# Patient Record
Sex: Female | Born: 1980
Health system: Southern US, Community
[De-identification: ages and names within clinical notes are randomized; demographics above are authoritative.]

## PROBLEM LIST (undated history)

## (undated) DIAGNOSIS — F419 Anxiety disorder, unspecified: Secondary | ICD-10-CM

## (undated) DIAGNOSIS — I10 Essential (primary) hypertension: Secondary | ICD-10-CM

## (undated) HISTORY — DX: Essential (primary) hypertension: I10

## (undated) HISTORY — DX: Anxiety disorder, unspecified: F41.9

## (undated) HISTORY — PX: TUBAL LIGATION: SHX77

---

## 1998-03-30 ENCOUNTER — Other Ambulatory Visit: Admission: RE | Admit: 1998-03-30 | Discharge: 1998-03-30 | Payer: Self-pay | Admitting: Family Medicine

## 1998-05-17 ENCOUNTER — Encounter: Payer: Self-pay | Admitting: Family Medicine

## 1998-05-17 ENCOUNTER — Ambulatory Visit (HOSPITAL_COMMUNITY): Admission: RE | Admit: 1998-05-17 | Discharge: 1998-05-17 | Payer: Self-pay | Admitting: Family Medicine

## 1998-10-25 ENCOUNTER — Inpatient Hospital Stay (HOSPITAL_COMMUNITY): Admission: AD | Admit: 1998-10-25 | Discharge: 1998-10-28 | Payer: Self-pay | Admitting: Family Medicine

## 1998-10-26 ENCOUNTER — Encounter: Payer: Self-pay | Admitting: Family Medicine

## 1999-08-02 ENCOUNTER — Other Ambulatory Visit: Admission: RE | Admit: 1999-08-02 | Discharge: 1999-08-02 | Payer: Self-pay | Admitting: Family Medicine

## 2004-08-04 ENCOUNTER — Other Ambulatory Visit: Admission: RE | Admit: 2004-08-04 | Discharge: 2004-08-04 | Payer: Self-pay | Admitting: Family Medicine

## 2006-04-07 ENCOUNTER — Emergency Department (HOSPITAL_COMMUNITY): Admission: EM | Admit: 2006-04-07 | Discharge: 2006-04-07 | Payer: Self-pay | Admitting: Emergency Medicine

## 2006-06-25 ENCOUNTER — Inpatient Hospital Stay (HOSPITAL_COMMUNITY): Admission: AD | Admit: 2006-06-25 | Discharge: 2006-07-01 | Payer: Self-pay | Admitting: Obstetrics and Gynecology

## 2006-06-25 ENCOUNTER — Ambulatory Visit: Payer: Self-pay | Admitting: Cardiology

## 2006-06-28 ENCOUNTER — Encounter (INDEPENDENT_AMBULATORY_CARE_PROVIDER_SITE_OTHER): Payer: Self-pay | Admitting: *Deleted

## 2006-06-28 ENCOUNTER — Ambulatory Visit: Payer: Self-pay | Admitting: Family Medicine

## 2006-06-28 ENCOUNTER — Encounter: Payer: Self-pay | Admitting: Obstetrics & Gynecology

## 2009-08-01 ENCOUNTER — Inpatient Hospital Stay (HOSPITAL_COMMUNITY): Admission: AD | Admit: 2009-08-01 | Discharge: 2009-08-01 | Payer: Self-pay | Admitting: Obstetrics and Gynecology

## 2009-09-07 ENCOUNTER — Ambulatory Visit: Payer: Self-pay | Admitting: Physician Assistant

## 2009-09-07 ENCOUNTER — Inpatient Hospital Stay (HOSPITAL_COMMUNITY): Admission: AD | Admit: 2009-09-07 | Discharge: 2009-09-07 | Payer: Self-pay | Admitting: Obstetrics and Gynecology

## 2009-09-16 ENCOUNTER — Inpatient Hospital Stay (HOSPITAL_COMMUNITY): Admission: AD | Admit: 2009-09-16 | Discharge: 2009-09-18 | Payer: Self-pay | Admitting: Obstetrics and Gynecology

## 2010-10-08 LAB — CBC
HCT: 29.3 % — ABNORMAL LOW (ref 36.0–46.0)
Hemoglobin: 9.8 g/dL — ABNORMAL LOW (ref 12.0–15.0)
MCHC: 33.4 g/dL (ref 30.0–36.0)
MCV: 91 fL (ref 78.0–100.0)
Platelets: 235 10*3/uL (ref 150–400)
RBC: 3.22 MIL/uL — ABNORMAL LOW (ref 3.87–5.11)
RDW: 13.9 % (ref 11.5–15.5)
WBC: 8.3 10*3/uL (ref 4.0–10.5)

## 2010-10-08 LAB — URINALYSIS, ROUTINE W REFLEX MICROSCOPIC
Bilirubin Urine: NEGATIVE
Glucose, UA: NEGATIVE mg/dL
Hgb urine dipstick: NEGATIVE
Ketones, ur: 15 mg/dL — AB
Nitrite: NEGATIVE
Protein, ur: NEGATIVE mg/dL
Specific Gravity, Urine: 1.025 (ref 1.005–1.030)
Urobilinogen, UA: 2 mg/dL — ABNORMAL HIGH (ref 0.0–1.0)
pH: 6.5 (ref 5.0–8.0)

## 2010-10-08 LAB — COMPREHENSIVE METABOLIC PANEL
ALT: 12 U/L (ref 0–35)
Alkaline Phosphatase: 76 U/L (ref 39–117)
BUN: 5 mg/dL — ABNORMAL LOW (ref 6–23)
CO2: 23 mEq/L (ref 19–32)
Chloride: 106 mEq/L (ref 96–112)
GFR calc non Af Amer: >60 mL/min (ref >60)
Glucose, Bld: 77 mg/dL (ref 70–99)
Potassium: 3.2 mEq/L — ABNORMAL LOW (ref 3.5–5.1)
Sodium: 137 mEq/L (ref 135–145)
Total Bilirubin: 0.5 mg/dL (ref 0.3–1.2)
Total Protein: 5.8 g/dL — ABNORMAL LOW (ref 6.0–8.3)

## 2010-10-08 LAB — DIFFERENTIAL
Basophils Absolute: 0 10*3/uL (ref 0.0–0.1)
Basophils Relative: 0 % (ref 0–1)
Eosinophils Absolute: 0.1 10*3/uL (ref 0.0–0.7)
Monocytes Relative: 8 % (ref 3–12)
Neutro Abs: 5.8 10*3/uL (ref 1.7–7.7)
Neutrophils Relative %: 70 % (ref 43–77)

## 2010-10-11 LAB — COMPREHENSIVE METABOLIC PANEL
ALT: 13 U/L (ref 0–35)
AST: 19 U/L (ref 0–37)
Albumin: 3 g/dL — ABNORMAL LOW (ref 3.5–5.2)
Calcium: 9.1 mg/dL (ref 8.4–10.5)
Chloride: 106 mEq/L (ref 96–112)
Creatinine, Ser: 0.53 mg/dL (ref 0.4–1.2)
GFR calc Af Amer: >60 mL/min (ref >60)
Sodium: 138 mEq/L (ref 135–145)
Total Bilirubin: 0.4 mg/dL (ref 0.3–1.2)

## 2010-10-11 LAB — URINALYSIS, DIPSTICK ONLY
Bilirubin Urine: NEGATIVE
Ketones, ur: 15 mg/dL — AB
Specific Gravity, Urine: <1.005 — ABNORMAL LOW (ref 1.005–1.030)
pH: 6.5 (ref 5.0–8.0)

## 2010-10-11 LAB — CBC
HCT: 24.3 % — ABNORMAL LOW (ref 36.0–46.0)
Hemoglobin: 11.1 g/dL — ABNORMAL LOW (ref 12.0–15.0)
Hemoglobin: 8.2 g/dL — ABNORMAL LOW (ref 12.0–15.0)
MCHC: 33.5 g/dL (ref 30.0–36.0)
RBC: 3.71 MIL/uL — ABNORMAL LOW (ref 3.87–5.11)
WBC: 21.3 10*3/uL — ABNORMAL HIGH (ref 4.0–10.5)

## 2010-10-11 LAB — RPR: RPR Ser Ql: NONREACTIVE

## 2010-10-11 LAB — HEMOGLOBIN AND HEMATOCRIT, BLOOD: HCT: 27.6 % — ABNORMAL LOW (ref 36.0–46.0)

## 2010-12-08 NOTE — Discharge Summary (Signed)
NAMEJAYLEA, Wendy Thompson                 ACCOUNT NO.:  [id number]   MEDICAL RECORD NO.:  [id number]          PATIENT TYPE:  INP   LOCATION:  9305                          FACILITY:  [facility]   PHYSICIAN:  Lesly Dukes, M.D. DATE OF BIRTH:  1981-05-07   DATE OF ADMISSION:  06/28/2006  DATE OF DISCHARGE:  07/01/2006                               DISCHARGE SUMMARY   REASON FOR ADMISSION:  Preeclampsia.   HOSPITAL COURSE:  This is a 30 year old, G2, P1-0-0-1 who presented at  28 weeks 4 days at [facility]. There it was noted that she had  severe TIAs and preeclampsia. Her creatinine went from 0.4 to 0.9. Her  urine output was around 20 mL an hour and decreasing. She had received a  course of betamethasone x2 at [facility]. She was transferred from [facility] over to [facility]. Upon arrival, we started her on magnesium  sulfate. It was noted that she had elevated blood pressure, systolic  130s to 168, diastolic 80-95. It was also noted that her urine output  was decreasing with 50, 50 and 75 over the first 3 hours here. Her  physical examination was within normal limits. Due to her abnormal labs  and presentation, we initially decided we would try to induce the  patient; however, on ultrasound it was noted that the baby was breech.  Therefore we took the patient to C section on the afternoon of December  7. See operative note for details. The patient went to C section on  June 28, 2006, had a low cervical transverse C section which produced  a  viable female infant with Apgar's of 7 at 1 minute and 8 at 5 minutes  and cord pH 7.32, no complications at time of delivery. Please see  operative note for full details. After delivery, the patient was  transferred to the [facility] where she continued to do well. She was  continued on magnesium sulfate for over 24 hours. During this time, she  continued to have very adequate diuresis but no greater than 150 mL of  urine an hour. Although  her pressures remained elevated, they began to  trend down. We did start her on HCTZ 25 mg p.o. daily which also helped  her blood pressures. The patient continued to have some edema and  abdominal distention; however, this is gradually improving each day. On  day of discharge, the patient continue to have very adequate urine  output and was negative over the past 16-hours with more out than in.  She continue to have some edema though this appeared to be mildly  improved from the previous evening. The patient's blood pressure on day  of discharge, two readings were 139/81 and 157/93.   PERTINENT LABS:  On the day of admission at [facility] labs  were drawn which showed abnormalities including an increase in  creatinine from 0.4 to 0.8, urine volume remained low at 525. Initial  labs on the 7th showed an elevated uric acid of 9.0 which was increased  on the 5th which was 7.8. Liver  functions on initial exam were within  normal limits. CBC, platelets were normal at 239, LDH on initial labs  are elevated at 278. Once admitted to [facility], repeat labs  showed elevated uric acid as well 9.3, BMP showed increased liver  enzymes and AST of 38, ALT of 34. Platelets were stable. Once she had  the C section, the labs continued to improve and magnesium levels  remained in normal range at 5. Followup CBCs showed normal platelets at  186, LDH stable at 289, Uric acid relatively at 9.8. On December 8,  liver enzymes increased mildly to 50 AST, 43 ALT. On December 9, repeat  labs, platelets stable at 165. Liver enzymes trended down, AST 20, ALT  31, LDH trending down within normal limits 203. On December 9, uric acid  still elevated at 10. The most recent hemoglobin on December 9 was 9.1,  hematocrit 26.1. This concludes pertinent labs.   PERTINENT RADIOGRAPH IMAGING:  Ultrasound performed showed the baby was  indeed breech.   DISCHARGE INSTRUCTIONS:  This patient was discharged in  improved and  stable condition July 01, 2006. She will followup with her regular  OB Mahalie Kanner, Dr. Emelda Fear, in [place], [place]. She will  return to this clinic on postoperative day #5 or 6 to have staples  removed. The patient is instructed to call his office to schedule this.  In addition, she will have a routine postpartum followup as needed or as  per Dr. Rayna Sexton request.   DISCHARGE MEDICATIONS:  The patient was discharged on:  1. HCTZ 25 mg for blood pressure control.  2. Percocet 5/325 one to two tabs p.o. q.6 h p.r.n. pain not      controlled with ibuprofen.  3. Ibuprofen 600 mg q.6 h.  4. Colace 1 tab p.o. b.i.d.  5. Micronor for contraception 1 tab p.o. daily.   CONDITION ON DISCHARGE:  Improved and stable.     ______________________________  Johney Maine, M.D.    ______________________________  Lesly Dukes, M.D.    JT/MEDQ  D:  07/01/2006  T:  07/01/2006  Job:  [id number]   cc:   Tilda Burrow, M.D.  Fax: [phone]

## 2010-12-08 NOTE — H&P (Signed)
NAMEMAIRELY, FOXWORTH                 ACCOUNT NO.:  [id number]   MEDICAL RECORD NO.:  [id number]          PATIENT TYPE:  INP   LOCATION:  LDR3                          [facility]ACILITY:  [facility]   PHYSICIAN:  Lazaro Arms, M.D.   DATE OF BIRTH:  1981-02-05   DATE OF ADMISSION:  06/24/2006  DATE OF DISCHARGE:  LH                              HISTORY & PHYSICAL   Brena is a 30 year old white female, gravida 2, para 1.  Estimated date  of delivery of September 16, 2006 who is currently at [redacted] weeks gestation  who was admitted via the office initially as an observation because  basically she just did not feel well and had a significant amount of  edema with weight gain over the last four weeks.  Her blood pressure was  slightly elevated at 150/90.  Mostly she just did not feel well.  She  had had some shortness of breath the night before which had  significantly resolved.  She had no headache, no scotoma, no right upper  quadrant pain.  She came over for observation.  She had basically normal  lab data except for uric acid of 6.4. just a bit elevated, and liver  function tests were normal.  CBC was normal.  We started a 24-hour  urine.  Also did fetal monitoring which was reactive and she would have  occasional decelerations.  As a result she was kept for observation and  for probable evolving preeclampsia.   Her history of this pregnancy is completely negative.  Her previous  pregnancy in 2000 was the same father.  She had no preeclampsia at that  time. Delivered at 40 weeks, 6 pounds 14 ounce infant.  Her blood  pressures initially in the office were 146/70, 112/74, 130/80, so she  was sort of slightly borderline but certainly not chronically  hypertensive.  Her protein has been negative throughout except in the  office on December 3 where it was trace.   PAST MEDICAL HISTORY:  Negative.   PAST SURGICAL HISTORY:  Negative.   OB HISTORY:  Stated as above.  Vaginal delivery in 2000 at 40 weeks  and  no preeclampsia.   ALLERGIES:  Watermelon which causes anaphylaxis.   MEDICATIONS:  Vitamins.   SOCIAL HISTORY:  She is married.   PRENATAL LABS:  Blood type is O negative.  Rubella is immune.  Hepatitis  B was negative.  Serology was nonreactive.  HIV nonreactive.  Pap smear  normal.  GC and Chlamydia was negative.  AFP was normal.  Group B strep  has not been performed.  Neither has her Glucola.   REVIEW OF SYSTEMS:  As per HPI.  Otherwise negative.   PHYSICAL EXAMINATION:  HEENT:  Unremarkable.  NECK:  Thyroid normal.  LUNGS:  Clear.  There are no crackles.  No evidence of pulmonary edema.  HEART:  Regular rate and rhythm with a soft systolic ejection murmur,  normal with pregnancy.  ABDOMEN:  Gravid with fundal height of 32 cm.  PELVIC:  Cervix is not assessed.  EXTREMITIES:  3-4+ edema up to  the knee.  NEUROLOGIC:  DTRs 2-3+ with clonus.   IMPRESSION:  1. Intrauterine pregnancy at [redacted] weeks gestation.  2. Possibly evolving preeclampsia.   PLAN:  The patient is admitted for evaluation, fetal monitoring.  Also  do a maternal echo and she will receive betamethasone in anticipation of  needing to be delivered prematurely.      Lazaro Arms, M.D.  Electronically Signed     LHE/MEDQ  D:  06/26/2006  T:  06/26/2006  Job:  [id number]

## 2010-12-08 NOTE — Op Note (Signed)
Wendy Thompson, Wendy Thompson                 ACCOUNT NO.:  [id number]   MEDICAL RECORD NO.:  [id number]          PATIENT TYPE:  OUT   LOCATION:  MFM                           [facility]:  [facility]   PHYSICIAN:  Tanya S. Shawnie Pons, M.D.   DATE OF BIRTH:  1981/02/10   DATE OF PROCEDURE:  06/28/2006  DATE OF DISCHARGE:                               OPERATIVE REPORT   PREOPERATIVE DIAGNOSIS:  Intrauterine pregnancy at 28 weeks 4 days,  severe pre-eclampsia, oliguria.   POSTOPERATIVE DIAGNOSIS:  Intrauterine pregnancy at 28 weeks 4 days,  severe pre-eclampsia, oliguria.   PROCEDURE:  Primary low transverse cesarean section.   SURGEON:  Shelbie Proctor. Shawnie Pons, M.D.   ASSISTANT:  Paticia Stack, M.D.   ANESTHESIA:  Spinal and local.   SPECIMENS:  Placenta sent to pathology.   ESTIMATED BLOOD LOSS:  700 mL.   COMPLICATIONS:  None.   FINDINGS:  Viable female infant, Apgars 7 and 8 at 1 and 5 minutes  respectively, cord pH 7.32.   INDICATIONS FOR PROCEDURE:  This is a 30 year old gravidaida 2, para 1-0-1-  1, with severe pre-eclampsia based on oliguria.  Urine output  approximately 20 mL per hour.  Creatinine worsened from 0.4 to 0.9  status post betamethasone x2.  The infant is in breech position at 15th  percentile with abnormal Dopplers, worsening LFTs.  She was taken for a  primary cesarean section.   DESCRIPTION OF PROCEDURE:  The patient was taken to the operating room  where spinal anesthesia was found to be adequate.  She was then prepped  and draped in the normal sterile fashion in the dorsal supine position  with a leftward tilt.  A Pfannenstiel skin incision was made with the  scalpel and carried through to the underlying layer of fascia.  The  fascia was incised in the midline and the incision was extended  laterally with the Mayo scissors.  The superior aspect of the fascial  incision was grasped with the Kocher clamps, elevated, and the  underlying rectus muscles dissected off bluntly.  Attention  was turned  to the inferior aspect of the incision which, in a similar fashion, was  grasped, tented up with Kocher clamps, and the rectus muscles dissected  off bluntly.  The rectus muscles were then separated in the midline, the  peritoneum was identified, tented up, and entered sharply with  Metzenbaum scissors.  The peritoneal incision was extended superiorly  and inferiorly with good visualization of the bladder.  The bladder  blade was inserted and the lower uterine segment incised in a transverse  fashion with the scalpel.  The uterine incision was extended laterally.  The bladder blade was removed and the infant delivered via breech  extraction.  The nose and mouth were suctioned and the cord clamped x2  and cut.  The infant was handed off to the awaiting pediatricians.  Cord  blood and cord gases were obtained.   The placenta was then removed manually.  The uterus was cleared of all  clots and debris.  The uterine incision was repaired with #1 chromic  in  a running locked fashion.  A second layer of the same suture was used  for an imbricating layer with excellent hemostasis.  The gutters were  cleared of all clots and the fascia was reapproximated with 0 Vicryl in  a running fashion.  The skin was closed with staples.  The patient  tolerated the procedure well.  Sponge, lap, and needle counts were  correct x2.  2 grams of Ancef was given at cord clamp.  The patient was  taken to the recovery room in stable condition.     ______________________________  Paticia Stack, MD    ______________________________  Shelbie Proctor. Shawnie Pons, M.D.    LNJ/MEDQ  D:  06/28/2006  T:  06/28/2006  Job:  [id number]

## 2010-12-08 NOTE — Procedures (Signed)
Wendy Thompson, DUFFY                 ACCOUNT NO.:  [id number]   MEDICAL RECORD NO.:  [id number]          PATIENT TYPE:  INP   LOCATION:  LDR3                          FACILITY:  [facility]   PHYSICIAN:  Gerrit Friends. Dietrich Pates, MD, FACCDATE OF BIRTH:  1980/12/12   DATE OF PROCEDURE:  06/25/2006  DATE OF DISCHARGE:                                ECHOCARDIOGRAM   CLINICAL DATA:  A 30 year old woman with a 28-week pregnancy,  hypertension and edema.   M-MODE:  Aorta 2.7, left atrium 4.9, septum 1.4, posterior wall 1.2, LV  diastole 4.7, LV systole 3.4, RV diastole 3.5.   IMPRESSION:  1. Technically adequate echocardiographic study.  2. Mild left atrial enlargement; normal right atrial size.  3. Borderline right ventricular size; normal wall thickness; normal      function.  4. Normal aortic, mitral, tricuspid and pulmonic valves; mild mitral      regurgitation.  5. Normal proximal pulmonary artery; normal proximal ascending aorta.  6. Normal left ventricular size; upper normal wall thickness; normal      regional and global function.  7. Slight inferior vena cava dilatation; diameter decreases normally      with inspiration.      Gerrit Friends. Dietrich Pates, MD, [facility]  Electronically Signed     RMR/MEDQ  D:  06/25/2006  T:  06/26/2006  Job:  [id number]

## 2010-12-08 NOTE — Discharge Summary (Signed)
Wendy Thompson, Wendy Thompson                 ACCOUNT NO.:  [id number]   MEDICAL RECORD NO.:  [id number]          PATIENT TYPE:  INP   LOCATION:  LDR3                          [facility]AC[facility]:  [facility]   PHYSICIAN:  Lazaro Arms, M.D.   DATE OF BIRTH:  [birth year]   DATE OF ADMISSION:  06/25/2006  DATE OF DISCHARGE:  12/07/2007LH                               DISCHARGE SUMMARY   Please refer to the transcribed history and physical and the antepartum  chart, details of admission to hospital.   DISCHARGE DIAGNOSES:  1. Intrauterine pregnancy at 28-1/2 weeks' gestation.  2. Worsening preeclampsia, now severe due to borderline oliguria.  3. Transfer to Dr. Kristen Loader service at [facility].   HOSPITAL COURSE:  Patient was admitted on June 25, 2006, because of  possible evolving pre-eclampsia.  The patient had a blood pressure of  150/90, and she has put on quite a bit of fluid over the past 4 weeks,  22 pounds.  We did a laboratory evaluation and have kept her.  Her blood  pressure subsequently went down.  Her 24-hour urine had a volume of only  500 cc, and she had 850 mg of protein in 24 hours.  Her DTRs were 3+  with a beat of clonus.  All of her other labs were normal, except for  uric acid being elevated now into the 9s.  She received betamethasone  x2, in anticipation of having to deliver her early.  When the last 24-  hour urine came back, and saw that she was certainly getting worse very  quickly, I wanted to get her transferred because of the need for fairly  imminent delivery.  I called Dr. Derinda Late, and he did accept the patient  in transfer for evaluation, and he understands possible delivery  imminently.  The patient and her family understands this.  She has had  reactive NSTs throughout the ultrasound, was also reassuring.  As a  result, she is transferred to [facility] labor and delivery unit,  Dr. Derinda Late.      Lazaro Arms, M.D.  Electronically Signed     LHE/MEDQ  D:   08/08/2006  T:  08/08/2006  Job:  [id number]

## 2014-02-10 ENCOUNTER — Encounter (INDEPENDENT_AMBULATORY_CARE_PROVIDER_SITE_OTHER): Payer: Self-pay

## 2014-02-10 ENCOUNTER — Encounter: Payer: Self-pay | Admitting: Family

## 2014-02-10 ENCOUNTER — Ambulatory Visit (INDEPENDENT_AMBULATORY_CARE_PROVIDER_SITE_OTHER): Payer: 59 | Admitting: Family

## 2014-02-10 VITALS — BP 157/108 | HR 89 | Temp 98.6°F | Ht 66.5 in | Wt 212.8 lb

## 2014-02-10 DIAGNOSIS — I10 Essential (primary) hypertension: Secondary | ICD-10-CM

## 2014-02-10 DIAGNOSIS — F411 Generalized anxiety disorder: Secondary | ICD-10-CM

## 2014-02-10 DIAGNOSIS — R1031 Right lower quadrant pain: Secondary | ICD-10-CM

## 2014-02-10 LAB — POCT CBC
Granulocyte percent: 70 %G (ref 37–80)
HEMATOCRIT: 42.6 % (ref 37.7–47.9)
Hemoglobin: 14.1 g/dL (ref 12.2–16.2)
LYMPH, POC: 2.1 (ref 0.6–3.4)
MCH: 29.3 pg (ref 27–31.2)
MCHC: 33.1 g/dL (ref 31.8–35.4)
MCV: 88.5 fL (ref 80–97)
MPV: 7.6 fL (ref 0–99.8)
PLATELET COUNT, POC: 222 10*3/uL (ref 142–424)
POC Granulocyte: 5.5 (ref 2–6.9)
POC LYMPH PERCENT: 27.3 %L (ref 10–50)
RBC: 4.8 M/uL (ref 4.04–5.48)
RDW, POC: 12.6 %
WBC: 7.8 10*3/uL (ref 4.6–10.2)

## 2014-02-10 MED ORDER — HYDROCHLOROTHIAZIDE 12.5 MG PO TABS: 12.5000 mg | ORAL_TABLET | Freq: Every day | ORAL | Status: DC

## 2014-02-10 MED ORDER — ESCITALOPRAM OXALATE 10 MG PO TABS: 10.0000 mg | ORAL_TABLET | Freq: Every day | ORAL | Status: DC

## 2014-02-10 NOTE — Progress Notes (Signed)
   Subjective:    Patient ID: Wendy Thompson, female    DOB: December 23, 1980, 33 y.o.   MRN: [id number]  Abdominal Pain This is a new problem. The current episode started 1 to 4 weeks ago ("two weeks ago"). The onset quality is sudden. The problem occurs intermittently. The problem has been waxing and waning. The pain is located in the RLQ. The pain is at a severity of 3/10. The pain is mild. The quality of the pain is aching. The abdominal pain radiates to the RUQ. Pertinent negatives include no belching, constipation, diarrhea, dysuria, frequency, headaches, hematochezia, hematuria, nausea or vomiting. The pain is aggravated by movement. She has tried acetaminophen for the symptoms. The treatment provided moderate relief. There is no history of Crohn's disease, gallstones, GERD or pancreatitis.      Review of Systems  Constitutional: Negative.   HENT: Negative.   Eyes: Negative.   Respiratory: Negative.  Negative for shortness of breath.   Cardiovascular: Negative.  Negative for palpitations.  Gastrointestinal: Positive for abdominal pain. Negative for nausea, vomiting, diarrhea, constipation and hematochezia.  Endocrine: Negative.   Genitourinary: Negative.  Negative for dysuria, frequency and hematuria.  Musculoskeletal: Negative.   Neurological: Negative.  Negative for headaches.  Hematological: Negative.   Psychiatric/Behavioral: Negative.   All other systems reviewed and are negative.      Objective:   Physical Exam  Vitals reviewed. Constitutional: She is oriented to person, place, and time. She appears well-developed and well-nourished. No distress.  HENT:  Head: Normocephalic and atraumatic.  Right Ear: External ear normal.  Mouth/Throat: Oropharynx is clear and moist.  Eyes: Pupils are equal, round, and reactive to light.  Neck: Normal range of motion. Neck supple. No thyromegaly present.  Cardiovascular: Normal rate, regular rhythm, normal heart sounds and intact distal  pulses.   No murmur heard. Pulmonary/Chest: Effort normal and breath sounds normal. No respiratory distress. She has no wheezes.  Abdominal: Soft. Bowel sounds are normal. She exhibits mass (small mass felt when pt stood up in RLQ). She exhibits no distension. There is no tenderness.  Genitourinary: Vagina normal.  Bimanual exam- no adnexal masses or tenderness, ovaries nonpalpable    Musculoskeletal: Normal range of motion. She exhibits no edema and no tenderness.  Neurological: She is alert and oriented to person, place, and time. She has normal reflexes. No cranial nerve deficit.  Skin: Skin is warm and dry.  Psychiatric: Her behavior is normal. Judgment and thought content normal. Her mood appears anxious.  Pt very tearful and anxious    BP 157/108  Pulse 89  Temp(Src) 98.6 F (37 C) (Oral)  Ht 5' 6.5" (1.689 m)  Wt 212 lb 12.8 oz (96.525 kg)  BMI 33.84 kg/m2  LMP 01/22/2014       Assessment & Plan:  1. RLQ abdominal pain - POCT CBC - CT Abdomen Pelvis W Contrast; Future - CMP14+EGFR  2. Essential hypertension, benign - CMP14+EGFR - hydrochlorothiazide (HYDRODIURIL) 12.5 MG tablet; Take 1 tablet (12.5 mg total) by mouth daily.  Dispense: 90 tablet; Refill: 3  3. Generalized anxiety disorder - escitalopram (LEXAPRO) 10 MG tablet; Take 1 tablet (10 mg total) by mouth daily.  Dispense: 90 tablet; Refill: 1  RTO in 2 weeks to recheck blood pressure and anxiety CT ordered and will call pt with results  Evelina Dun, FNP

## 2014-02-10 NOTE — Patient Instructions (Signed)

## 2014-02-10 NOTE — Addendum Note (Signed)
Addended by: Jannifer RodneyHAWKS, CHRISTY A on: 02/10/2014 01:50 PM   Modules accepted: Level of Service

## 2014-02-11 LAB — CMP14+EGFR
ALBUMIN: 4.7 g/dL (ref 3.5–5.5)
ALK PHOS: 66 IU/L (ref 39–117)
ALT: 14 IU/L (ref 0–32)
AST: 12 IU/L (ref 0–40)
Albumin/Globulin Ratio: 2.4 (ref 1.1–2.5)
BILIRUBIN TOTAL: 0.4 mg/dL (ref 0.0–1.2)
BUN / CREAT RATIO: 11 (ref 8–20)
BUN: 10 mg/dL (ref 6–20)
CHLORIDE: 103 mmol/L (ref 97–108)
CO2: 23 mmol/L (ref 18–29)
Calcium: 9.2 mg/dL (ref 8.7–10.2)
Creatinine, Ser: 0.88 mg/dL (ref 0.57–1.00)
GFR calc non Af Amer: 85 mL/min/{1.73_m2} (ref >59)
GFR, EST AFRICAN AMERICAN: 98 mL/min/{1.73_m2} (ref >59)
GLOBULIN, TOTAL: 2 g/dL (ref 1.5–4.5)
Glucose: 82 mg/dL (ref 65–99)
Potassium: 4.2 mmol/L (ref 3.5–5.2)
SODIUM: 141 mmol/L (ref 134–144)
Total Protein: 6.7 g/dL (ref 6.0–8.5)

## 2014-02-15 ENCOUNTER — Other Ambulatory Visit: Payer: Self-pay | Admitting: Family

## 2014-02-15 ENCOUNTER — Ambulatory Visit (HOSPITAL_COMMUNITY)
Admission: RE | Admit: 2014-02-15 | Discharge: 2014-02-15 | Disposition: A | Discharge status: Home / Self Care | Payer: 59 | Source: Ambulatory Visit | Attending: Family | Admitting: Family

## 2014-02-15 ENCOUNTER — Other Ambulatory Visit: Payer: Self-pay | Admitting: *Deleted

## 2014-02-15 ENCOUNTER — Telehealth: Payer: Self-pay | Admitting: Family Medicine

## 2014-02-15 DIAGNOSIS — R1011 Right upper quadrant pain: Secondary | ICD-10-CM | POA: Diagnosis present

## 2014-02-15 DIAGNOSIS — R1031 Right lower quadrant pain: Secondary | ICD-10-CM

## 2014-02-15 DIAGNOSIS — I7090 Unspecified atherosclerosis: Secondary | ICD-10-CM | POA: Diagnosis not present

## 2014-02-15 MED ORDER — IOHEXOL 300 MG/ML  SOLN
100.0000 mL | Freq: Once | INTRAMUSCULAR | Status: AC | PRN
  Administered 2014-02-15: 100 mL via INTRAVENOUS

## 2014-02-15 NOTE — Telephone Encounter (Signed)
Message copied by Azalee CourseFULP, Jamillah Camilo on Mon Feb 15, 2014 10:15 AM ------      Message from: Lendon ColonelHAWKS, [place]RISTY A      Created: Mon Feb 15, 2014  9:56 AM       CBC (WBC, Hgb, & Plts) WNL      Kidney and liver function stable       ------

## 2014-02-24 ENCOUNTER — Ambulatory Visit (INDEPENDENT_AMBULATORY_CARE_PROVIDER_SITE_OTHER): Payer: 59 | Admitting: Family

## 2014-02-24 ENCOUNTER — Encounter: Payer: Self-pay | Admitting: Family

## 2014-02-24 VITALS — BP 152/104 | HR 90 | Temp 99.0°F | Ht 66.5 in | Wt 211.6 lb

## 2014-02-24 DIAGNOSIS — F411 Generalized anxiety disorder: Secondary | ICD-10-CM | POA: Insufficient documentation

## 2014-02-24 DIAGNOSIS — I10 Essential (primary) hypertension: Secondary | ICD-10-CM

## 2014-02-24 MED ORDER — LISINOPRIL 20 MG PO TABS: 20.0000 mg | ORAL_TABLET | Freq: Every day | ORAL | Status: DC

## 2014-02-24 MED ORDER — ESCITALOPRAM OXALATE 20 MG PO TABS: 20.0000 mg | ORAL_TABLET | Freq: Every day | ORAL | Status: DC

## 2014-02-24 NOTE — Patient Instructions (Addendum)
Hypertension Hypertension, commonly called high blood pressure, is when the force of blood pumping through your arteries is too strong. Your arteries are the blood vessels that carry blood from your heart throughout your body. A blood pressure reading consists of a higher number over a lower number, such as 110/72. The higher number (systolic) is the pressure inside your arteries when your heart pumps. The lower number (diastolic) is the pressure inside your arteries when your heart relaxes. Ideally you want your blood pressure below 120/80. Hypertension forces your heart to work harder to pump blood. Your arteries may become narrow or stiff. Having hypertension puts you at risk for heart disease, stroke, and other problems.  RISK FACTORS Some risk factors for high blood pressure are controllable. Others are not.  Risk factors you cannot control include:   Race. You may be at higher risk if you are African American.  Age. Risk increases with age.  Gender. Men are at higher risk than women before age 45 years. After age 65, women are at higher risk than men. Risk factors you can control include:  Not getting enough exercise or physical activity.  Being overweight.  Getting too much fat, sugar, calories, or salt in your diet.  Drinking too much alcohol. SIGNS AND SYMPTOMS Hypertension does not usually cause signs or symptoms. Extremely high blood pressure (hypertensive crisis) may cause headache, anxiety, shortness of breath, and nosebleed. DIAGNOSIS  To check if you have hypertension, your health care provider will measure your blood pressure while you are seated, with your arm held at the level of your heart. It should be measured at least twice using the same arm. Certain conditions can cause a difference in blood pressure between your right and left arms. A blood pressure reading that is higher than normal on one occasion does not mean that you need treatment. If one blood pressure reading  is high, ask your health care provider about having it checked again. TREATMENT  Treating high blood pressure includes making lifestyle changes and possibly taking medicine. Living a healthy lifestyle can help lower high blood pressure. You may need to change some of your habits. Lifestyle changes may include:  Following the DASH diet. This diet is high in fruits, vegetables, and whole grains. It is low in salt, red meat, and added sugars.  Getting at least 2 hours of brisk physical activity every week.  Losing weight if necessary.  Not smoking.  Limiting alcoholic beverages.  Learning ways to reduce stress. If lifestyle changes are not enough to get your blood pressure under control, your health care provider may prescribe medicine. You may need to take more than one. Work closely with your health care provider to understand the risks and benefits. HOME CARE INSTRUCTIONS  Have your blood pressure rechecked as directed by your health care provider.   Take medicines only as directed by your health care provider. Follow the directions carefully. Blood pressure medicines must be taken as prescribed. The medicine does not work as well when you skip doses. Skipping doses also puts you at risk for problems.   Do not smoke.   Monitor your blood pressure at home as directed by your health care provider. SEEK MEDICAL CARE IF:   You think you are having a reaction to medicines taken.  You have recurrent headaches or feel dizzy.  You have swelling in your ankles.  You have trouble with your vision. SEEK IMMEDIATE MEDICAL CARE IF:  You develop a severe headache or confusion.    You have unusual weakness, numbness, or feel faint.  You have severe chest or abdominal pain.  You vomit repeatedly.  You have trouble breathing. MAKE SURE YOU:   Understand these instructions.  Will watch your condition.  Will get help right away if you are not doing well or get worse. Document  Released: 07/09/2005 Document Revised: 11/23/2013 Document Reviewed: 05/01/2013 Exit[employer] Patient Information 2015 [employer]. This information is not intended to replace advice given to you by your health care provider. Make sure you discuss any questions you have with your health care provider. DASH Eating Plan DASH stands for "Dietary Approaches to Stop Hypertension." The DASH eating plan is a healthy eating plan that has been shown to reduce high blood pressure (hypertension). Additional health benefits may include reducing the risk of type 2 diabetes mellitus, heart disease, and stroke. The DASH eating plan may also help with weight loss. WHAT DO I NEED TO KNOW ABOUT THE DASH EATING PLAN? For the DASH eating plan, you will follow these general guidelines:  Choose foods with a percent daily value for sodium of less than 5% (as listed on the food label).  Use salt-free seasonings or herbs instead of table salt or sea salt.  Check with your health care provider or pharmacist before using salt substitutes.  Eat lower-sodium products, often labeled as "lower sodium" or "no salt added."  Eat fresh foods.  Eat more vegetables, fruits, and low-fat dairy products.  Choose whole grains. Look for the word "whole" as the first word in the ingredient list.  Choose fish and skinless chicken or turkey more often than red meat. Limit fish, poultry, and meat to 6 oz (170 g) each day.  Limit sweets, desserts, sugars, and sugary drinks.  Choose heart-healthy fats.  Limit cheese to 1 oz (28 g) per day.  Eat more home-cooked food and less restaurant, buffet, and fast food.  Limit fried foods.  Cook foods using methods other than frying.  Limit canned vegetables. If you do use them, rinse them well to decrease the sodium.  When eating at a restaurant, ask that your food be prepared with less salt, or no salt if possible. WHAT FOODS CAN I EAT? Seek help from a dietitian for individual  calorie needs. Grains Whole grain or whole wheat bread. Brown rice. Whole grain or whole wheat pasta. Quinoa, bulgur, and whole grain cereals. Low-sodium cereals. Corn or whole wheat flour tortillas. Whole grain cornbread. Whole grain crackers. Low-sodium crackers. Vegetables Fresh or frozen vegetables (raw, steamed, roasted, or grilled). Low-sodium or reduced-sodium tomato and vegetable juices. Low-sodium or reduced-sodium tomato sauce and paste. Low-sodium or reduced-sodium canned vegetables.  Fruits All fresh, canned (in natural juice), or frozen fruits. Meat and Other Protein Products Ground beef (85% or leaner), grass-fed beef, or beef trimmed of fat. Skinless chicken or turkey. Ground chicken or turkey. Pork trimmed of fat. All fish and seafood. Eggs. Dried beans, peas, or lentils. Unsalted nuts and seeds. Unsalted canned beans. Dairy Low-fat dairy products, such as skim or 1% milk, 2% or reduced-fat cheeses, low-fat ricotta or cottage cheese, or plain low-fat yogurt. Low-sodium or reduced-sodium cheeses. Fats and Oils Tub margarines without trans fats. Light or reduced-fat mayonnaise and salad dressings (reduced sodium). Avocado. Safflower, olive, or canola oils. Natural peanut or almond butter. Other Unsalted popcorn and pretzels. The items listed above may not be a complete list of recommended foods or beverages. Contact your dietitian for more options. WHAT FOODS ARE NOT RECOMMENDED? Grains White bread.   White pasta. White rice. Refined cornbread. Bagels and croissants. Crackers that contain trans fat. Vegetables Creamed or fried vegetables. Vegetables in a cheese sauce. Regular canned vegetables. Regular canned tomato sauce and paste. Regular tomato and vegetable juices. Fruits Dried fruits. Canned fruit in light or heavy syrup. Fruit juice. Meat and Other Protein Products Fatty cuts of meat. Ribs, chicken wings, bacon, sausage, bologna, salami, chitterlings, fatback, hot dogs,  bratwurst, and packaged luncheon meats. Salted nuts and seeds. Canned beans with salt. Dairy Whole or 2% milk, cream, half-and-half, and cream cheese. Whole-fat or sweetened yogurt. Full-fat cheeses or blue cheese. Nondairy creamers and whipped toppings. Processed cheese, cheese spreads, or cheese curds. Condiments Onion and garlic salt, seasoned salt, table salt, and sea salt. Canned and packaged gravies. Worcestershire sauce. Tartar sauce. Barbecue sauce. Teriyaki sauce. Soy sauce, including reduced sodium. Steak sauce. Fish sauce. Oyster sauce. Cocktail sauce. Horseradish. Ketchup and mustard. Meat flavorings and tenderizers. Bouillon cubes. Hot sauce. Tabasco sauce. Marinades. Taco seasonings. Relishes. Fats and Oils Butter, stick margarine, lard, shortening, ghee, and bacon fat. Coconut, palm kernel, or palm oils. Regular salad dressings. Other Pickles and olives. Salted popcorn and pretzels. The items listed above may not be a complete list of foods and beverages to avoid. Contact your dietitian for more information. WHERE CAN I FIND MORE INFORMATION? National Heart, [employer], [employer] Institute: www.nhlbi.nih.gov/health/health-topics/topics/dash/ Document Released: 06/28/2011 Document Revised: 11/23/2013 Document Reviewed: 05/13/2013 ExitCare Patient Information 2015 [employer]. This information is not intended to replace advice given to you by your health care provider. Make sure you discuss any questions you have with your health care provider.  

## 2014-02-24 NOTE — Progress Notes (Signed)
   Subjective:    Patient ID: Wendy Thompson, female    DOB: 04/24/1981, 33 y.o.   MRN: [id number]  Pt presents to the office for follow up on GAD and hypertension. Hypertension This is a chronic problem. The current episode started more than 1 year ago. The problem has been waxing and waning since onset. The problem is uncontrolled. Associated symptoms include anxiety. Pertinent negatives include no headaches, palpitations, peripheral edema or shortness of breath. Risk factors for coronary artery disease include obesity. Past treatments include diuretics. The current treatment provides no improvement. There is no history of kidney disease, CAD/MI or heart failure.  Anxiety Presents for follow-up visit. Symptoms include excessive worry, irritability, nervous/anxious behavior and panic. Patient reports no compulsions, depressed mood, palpitations or shortness of breath. Symptoms occur occasionally.   Her past medical history is significant for anxiety/panic attacks. There is no history of depression. Past treatments include SSRIs. Compliance with prior treatments has been good.      Review of Systems  Constitutional: Positive for irritability.  HENT: Negative.   Eyes: Negative.   Respiratory: Negative.  Negative for shortness of breath.   Cardiovascular: Negative.  Negative for palpitations.  Gastrointestinal: Negative.   Endocrine: Negative.   Genitourinary: Negative.   Musculoskeletal: Negative.   Neurological: Negative.  Negative for headaches.  Hematological: Negative.   Psychiatric/Behavioral: The patient is nervous/anxious.   All other systems reviewed and are negative.      Objective:   Physical Exam  Vitals reviewed. Constitutional: She is oriented to person, place, and time. She appears well-developed and well-nourished. No distress.  Cardiovascular: Normal rate, regular rhythm, normal heart sounds and intact distal pulses.   No murmur heard. Pulmonary/Chest: Effort normal  and breath sounds normal. No respiratory distress. She has no wheezes.  Abdominal: Soft. Bowel sounds are normal. She exhibits no distension. There is no tenderness.  Musculoskeletal: Normal range of motion. She exhibits no edema and no tenderness.  Neurological: She is alert and oriented to person, place, and time. She has normal reflexes. No cranial nerve deficit.  Skin: Skin is warm and dry.  Psychiatric: She has a normal mood and affect. Her behavior is normal. Judgment and thought content normal.      BP 152/104  Pulse 90  Temp(Src) 99 F (37.2 C) (Oral)  Ht 5' 6.5" (1.689 m)  Wt 211 lb 9.6 oz (95.981 kg)  BMI 33.65 kg/m2  LMP 01/22/2014     Assessment & Plan:  1. Essential hypertension, benign -Added lisinopril 20 mg daily Dash diet information given -Exercise encouraged - Stress Management  -Continue current meds -RTO in 2 week - lisinopril (PRINIVIL,ZESTRIL) 20 MG tablet; Take 1 tablet (20 mg total) by mouth daily.  Dispense: 90 tablet; Refill: 3  2. GAD (generalized anxiety disorder) -Stress management discussed - escitalopram (LEXAPRO) 20 MG tablet; Take 1 tablet (20 mg total) by mouth daily.  Dispense: 90 tablet; Refill: 3   Jannifer Rodneyhristy Kaymen Adrian, FNP

## 2014-03-12 ENCOUNTER — Ambulatory Visit: Payer: 59 | Admitting: Family

## 2014-03-24 ENCOUNTER — Ambulatory Visit: Payer: 59 | Admitting: Family

## 2014-03-25 ENCOUNTER — Telehealth: Payer: Self-pay | Admitting: Family

## 2014-03-25 NOTE — Telephone Encounter (Signed)
Appt scheduled. Patient aware. 

## 2014-03-30 ENCOUNTER — Ambulatory Visit (INDEPENDENT_AMBULATORY_CARE_PROVIDER_SITE_OTHER): Payer: 59 | Admitting: Family

## 2014-03-30 ENCOUNTER — Encounter: Payer: Self-pay | Admitting: Family

## 2014-03-30 VITALS — BP 116/73 | HR 88 | Temp 98.0°F | Ht 66.5 in | Wt 213.0 lb

## 2014-03-30 DIAGNOSIS — I10 Essential (primary) hypertension: Secondary | ICD-10-CM

## 2014-03-30 DIAGNOSIS — F411 Generalized anxiety disorder: Secondary | ICD-10-CM

## 2014-03-30 NOTE — Patient Instructions (Signed)

## 2014-03-30 NOTE — Progress Notes (Signed)
   Subjective:    Patient ID: Wendy Thompson, female    DOB: 12-Sep-1980, 33 y.o.   MRN: [id number]  Hypertension Associated symptoms include anxiety. Pertinent negatives include no headaches, palpitations or shortness of breath.  Anxiety Patient reports no palpitations or shortness of breath.     Pt presents to the office for follow-up for hypertension and GAD. Pt was started on lisinopril 20 mg and hydrochlorothiazide 12.5 mg. Pt's blood pressure is at goal today.  Pt was also started on lexapro 20 mg but would like to discontinue medication due to sexual side effects. Pt states she has no sex drive since starting medication. Discussed tampering dose over next 2 weeks to reduce any adverse effects of medication.    Review of Systems  Constitutional: Negative.   HENT: Negative.   Eyes: Negative.   Respiratory: Negative.  Negative for shortness of breath.   Cardiovascular: Negative.  Negative for palpitations.  Gastrointestinal: Negative.   Endocrine: Negative.   Genitourinary: Negative.   Musculoskeletal: Negative.   Neurological: Negative.  Negative for headaches.  Hematological: Negative.   Psychiatric/Behavioral: Negative.   All other systems reviewed and are negative.      Objective:   Physical Exam  Vitals reviewed. Constitutional: She is oriented to person, place, and time. She appears well-developed and well-nourished. No distress.  Cardiovascular: Normal rate, regular rhythm, normal heart sounds and intact distal pulses.   No murmur heard. Pulmonary/Chest: Effort normal and breath sounds normal. No respiratory distress. She has no wheezes.  Abdominal: Soft. Bowel sounds are normal. She exhibits no distension. There is no tenderness.  Musculoskeletal: Normal range of motion. She exhibits no edema and no tenderness.  Neurological: She is alert and oriented to person, place, and time. She has normal reflexes. No cranial nerve deficit.  Skin: Skin is warm and dry.    Psychiatric: She has a normal mood and affect. Her behavior is normal. Judgment and thought content normal.    BP 116/73  Pulse 88  Temp(Src) 98 F (36.7 C) (Oral)  Ht 5' 6.5" (1.689 m)  Wt 213 lb (96.616 kg)  BMI 33.87 kg/m2  LMP 03/18/2014       Assessment & Plan:  1. Essential hypertension, benign Continue all medications-Blood pressure at goal  2. GAD (generalized anxiety disorder) -Discussed tampering lexapro dose over next 2 weeks and pt to try something natural -Pt feels like she does not need to be on anxiety medication every day -RTO 1 year   Jannifer Rodney, FNP

## 2015-01-20 ENCOUNTER — Other Ambulatory Visit: Payer: Self-pay | Admitting: Family

## 2015-01-28 ENCOUNTER — Other Ambulatory Visit: Payer: Self-pay | Admitting: Family

## 2015-01-28 NOTE — Telephone Encounter (Signed)
Last seen 03/30/14 Wendy Thompson  No upcoming appt scheduled  Requesting 90 day supply

## 2015-02-10 ENCOUNTER — Ambulatory Visit (INDEPENDENT_AMBULATORY_CARE_PROVIDER_SITE_OTHER): Payer: 59 | Admitting: Family

## 2015-02-10 ENCOUNTER — Encounter: Payer: Self-pay | Admitting: Family

## 2015-02-10 VITALS — BP 115/81 | HR 111 | Temp 98.6°F | Ht 66.5 in | Wt 220.0 lb

## 2015-02-10 DIAGNOSIS — I10 Essential (primary) hypertension: Secondary | ICD-10-CM

## 2015-02-10 DIAGNOSIS — Z0000 Encounter for general adult medical examination without abnormal findings: Secondary | ICD-10-CM | POA: Diagnosis not present

## 2015-02-10 DIAGNOSIS — Z01419 Encounter for gynecological examination (general) (routine) without abnormal findings: Secondary | ICD-10-CM

## 2015-02-10 DIAGNOSIS — F411 Generalized anxiety disorder: Secondary | ICD-10-CM | POA: Diagnosis not present

## 2015-02-10 LAB — POCT URINALYSIS DIPSTICK
BILIRUBIN UA: NEGATIVE
GLUCOSE UA: NEGATIVE
KETONES UA: NEGATIVE
NITRITE UA: NEGATIVE
Protein, UA: NEGATIVE
RBC UA: NEGATIVE
Spec Grav, UA: 1.02
Urobilinogen, UA: 4
pH, UA: 6.5

## 2015-02-10 LAB — POCT CBC
Granulocyte percent: 68.7 %G (ref 37–80)
HCT, POC: 38.6 % (ref 37.7–47.9)
HEMOGLOBIN: 13.1 g/dL (ref 12.2–16.2)
Lymph, poc: 2.1 (ref 0.6–3.4)
MCH: 29.6 pg (ref 27–31.2)
MCHC: 34 g/dL (ref 31.8–35.4)
MCV: 86.9 fL (ref 80–97)
MPV: 6.6 fL (ref 0–99.8)
PLATELET COUNT, POC: 265 10*3/uL (ref 142–424)
POC GRANULOCYTE: 5.9 (ref 2–6.9)
POC LYMPH PERCENT: 24.8 %L (ref 10–50)
RBC: 4.44 M/uL (ref 4.04–5.48)
RDW, POC: 12.3 %
WBC: 8.6 10*3/uL (ref 4.6–10.2)

## 2015-02-10 LAB — POCT UA - MICROSCOPIC ONLY
Bacteria, U Microscopic: NEGATIVE
CASTS, UR, LPF, POC: NEGATIVE
Crystals, Ur, HPF, POC: NEGATIVE
Mucus, UA: NEGATIVE
Yeast, UA: NEGATIVE

## 2015-02-10 NOTE — Progress Notes (Signed)
Subjective:    Patient ID: Wendy Thompson, female    DOB: [birth year], 34 y.o.   MRN: [id number]  Pt presents to the office today for CPE with pap.  Gynecologic Exam Associated symptoms include urgency. Pertinent negatives include no headaches.  Hypertension This is a chronic problem. The current episode started more than 1 year ago. The problem has been resolved since onset. The problem is controlled. Associated symptoms include anxiety. Pertinent negatives include no headaches, palpitations, peripheral edema or shortness of breath. Risk factors for coronary artery disease include obesity. Past treatments include ACE inhibitors and diuretics. The current treatment provides significant improvement. Hypertensive end-organ damage includes kidney disease. There is no history of CAD/MI, CVA, heart failure or a thyroid problem. There is no history of sleep apnea.  Anxiety Presents for follow-up visit. The problem has been waxing and waning. Patient reports no depressed mood, excessive worry, palpitations or shortness of breath. Symptoms occur rarely. The severity of symptoms is mild.   Past treatments include lifestyle changes. The treatment provided significant relief. Compliance with prior treatments has been good.      Review of Systems  Constitutional: Negative.   HENT: Negative.   Eyes: Negative.   Respiratory: Negative.  Negative for shortness of breath.   Cardiovascular: Negative.  Negative for palpitations.  Gastrointestinal: Negative.   Endocrine: Negative.   Genitourinary: Positive for urgency.  Musculoskeletal: Negative.   Neurological: Negative.  Negative for headaches.  Hematological: Negative.   Psychiatric/Behavioral: Negative.   All other systems reviewed and are negative.      Objective:   Physical Exam  Constitutional: She is oriented to person, place, and time. She appears well-developed and well-nourished. No distress.  HENT:  Head: Normocephalic and atraumatic.    Right Ear: External ear normal.  Mouth/Throat: Oropharynx is clear and moist.  Eyes: Pupils are equal, round, and reactive to light.  Neck: Normal range of motion. Neck supple. No thyromegaly present.  Cardiovascular: Normal rate, regular rhythm, normal heart sounds and intact distal pulses.   No murmur heard. Pulmonary/Chest: Effort normal and breath sounds normal. No respiratory distress. She has no wheezes. Right breast exhibits no inverted nipple, no mass, no nipple discharge, no skin change and no tenderness. Left breast exhibits no inverted nipple, no mass, no nipple discharge, no skin change and no tenderness. Breasts are symmetrical.  Abdominal: Soft. Bowel sounds are normal. She exhibits no distension. There is no tenderness.  Genitourinary: Vagina normal.  Bimanual exam- no adnexal masses or tenderness, ovaries nonpalpable   Cervix parous and pink- No discharge   Musculoskeletal: Normal range of motion. She exhibits no edema or tenderness.  Neurological: She is alert and oriented to person, place, and time. She has normal reflexes. No cranial nerve deficit.  Skin: Skin is warm and dry.  Psychiatric: She has a normal mood and affect. Her behavior is normal. Judgment and thought content normal.  Vitals reviewed.     BP 115/81 mmHg  Pulse 111  Temp(Src) 98.6 F (37 C) (Oral)  Ht 5' 6.5" (1.689 m)  Wt 220 lb (99.791 kg)  BMI 34.98 kg/m2  LMP 02/03/2015     Assessment & Plan:  1. Essential hypertension, benign - CMP14+EGFR  2. Encounter for routine gynecological examination - POCT urinalysis dipstick - POCT UA - Microscopic Only - CMP14+EGFR  3. Annual physical exam - POCT CBC - CMP14+EGFR - Lipid panel - Thyroid Panel With TSH - Vit D  25 hydroxy (rtn osteoporosis monitoring) - Pap  IG w/ reflex to HPV when ASC-U  4. GAD (generalized anxiety disorder) - CMP14+EGFR   Continue all meds Labs pending Health Maintenance reviewed Diet and exercise  encouraged RTO 1 year  Evelina Dun, FNP

## 2015-02-10 NOTE — Patient Instructions (Signed)

## 2015-02-11 LAB — CMP14+EGFR
ALT: 16 IU/L (ref 0–32)
AST: 15 IU/L (ref 0–40)
Albumin/Globulin Ratio: 1.9 (ref 1.1–2.5)
Albumin: 4.4 g/dL (ref 3.5–5.5)
Alkaline Phosphatase: 53 IU/L (ref 39–117)
BUN / CREAT RATIO: 19 (ref 8–20)
BUN: 15 mg/dL (ref 6–20)
Bilirubin Total: 0.4 mg/dL (ref 0.0–1.2)
CO2: 19 mmol/L (ref 18–29)
CREATININE: 0.8 mg/dL (ref 0.57–1.00)
Calcium: 8.9 mg/dL (ref 8.7–10.2)
Chloride: 99 mmol/L (ref 97–108)
GFR, EST AFRICAN AMERICAN: 110 mL/min/{1.73_m2} (ref >59)
GFR, EST NON AFRICAN AMERICAN: 95 mL/min/{1.73_m2} (ref >59)
GLOBULIN, TOTAL: 2.3 g/dL (ref 1.5–4.5)
Glucose: 91 mg/dL (ref 65–99)
Potassium: 3.8 mmol/L (ref 3.5–5.2)
Sodium: 139 mmol/L (ref 134–144)
TOTAL PROTEIN: 6.7 g/dL (ref 6.0–8.5)

## 2015-02-11 LAB — THYROID PANEL WITH TSH
FREE THYROXINE INDEX: 2.2 (ref 1.2–4.9)
T3 Uptake Ratio: 28 % (ref 24–39)
T4, Total: 7.8 ug/dL (ref 4.5–12.0)
TSH: 0.737 u[IU]/mL (ref 0.450–4.500)

## 2015-02-11 LAB — LIPID PANEL
CHOL/HDL RATIO: 9.5 ratio — AB (ref 0.0–4.4)
CHOLESTEROL TOTAL: 208 mg/dL — AB (ref 100–199)
HDL: 22 mg/dL — ABNORMAL LOW (ref >39)
LDL Calculated: 111 mg/dL — ABNORMAL HIGH (ref 0–99)
TRIGLYCERIDES: 377 mg/dL — AB (ref 0–149)
VLDL Cholesterol Cal: 75 mg/dL — ABNORMAL HIGH (ref 5–40)

## 2015-02-11 LAB — VITAMIN D 25 HYDROXY (VIT D DEFICIENCY, FRACTURES): Vit D, 25-Hydroxy: 32.2 ng/mL (ref 30.0–100.0)

## 2015-02-13 LAB — PAP IG W/ RFLX HPV ASCU: PAP Smear Comment: 0

## 2015-02-14 ENCOUNTER — Other Ambulatory Visit: Payer: Self-pay | Admitting: Family

## 2015-02-14 ENCOUNTER — Telehealth: Payer: Self-pay | Admitting: *Deleted

## 2015-02-14 DIAGNOSIS — E785 Hyperlipidemia, unspecified: Secondary | ICD-10-CM

## 2015-02-14 MED ORDER — ATORVASTATIN CALCIUM 40 MG PO TABS: 40.0000 mg | ORAL_TABLET | Freq: Every day | ORAL | Status: DC

## 2015-02-14 NOTE — Telephone Encounter (Signed)
-----   Message from Junie Spencer, FNP sent at 02/14/2015  8:29 AM EDT ----- CBC- WNL Kidney and liver function stable Pap negative for lesion or malignancy  LDL and Triglycerides levels elevated- Pt needs to be on low fat diet, Lipitor Prescription sent to pharmacy  Thyroid levels WNL Vit D levels WNL Urine negative

## 2015-02-14 NOTE — Telephone Encounter (Signed)
Pt notified of results Verbalizes understanding 

## 2015-03-22 ENCOUNTER — Other Ambulatory Visit: Payer: Self-pay | Admitting: Family

## 2015-04-10 IMAGING — CT CT ABD-PELV W/ CM
2 of 4 series · 15 of 46 positions shown, 17 images · IV contrast (Omnipaque 300)
Comparison: None.

CLINICAL DATA: Right lower quadrant and right upper quadrant pain.
Question appendicitis. Palpable abnormality in right lower quadrant
since 01/23/2014.

EXAM:
CT ABDOMEN AND PELVIS WITH CONTRAST
TECHNIQUE: Multidetector CT imaging of the abdomen and pelvis was performed
using the standard protocol following bolus administration of
intravenous contrast.
CONTRAST:  100mL OMNIPAQUE IOHEXOL 300 MG/ML  SOLN

[Series 2: abd_pel_with 5.0 b40f · axial · 0.71mm/px · z∈[-473,-48]mm · 12 of 97 slices shown, 14 images]
[im 8/97  soft-tissue]
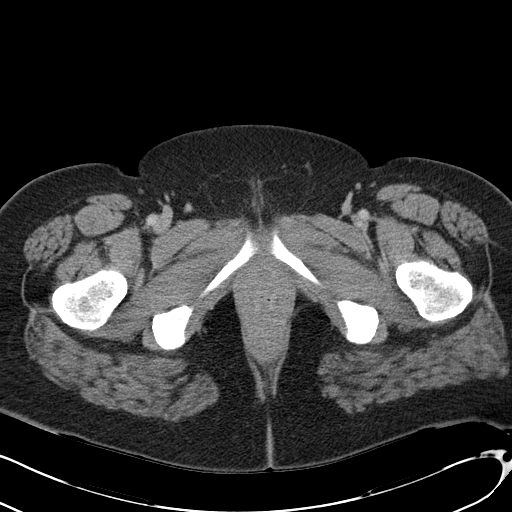
[im 8/97  bone]
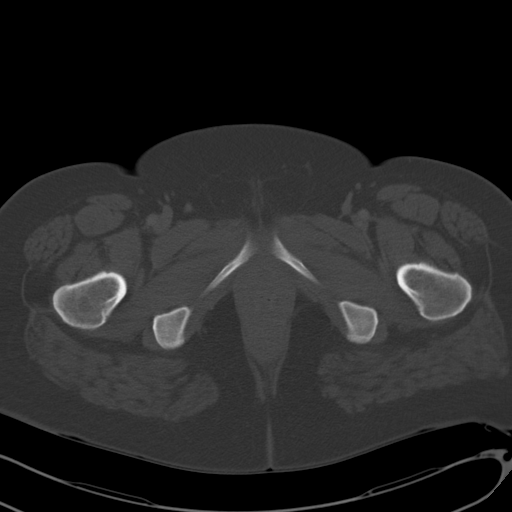
[im 16/97  soft-tissue]
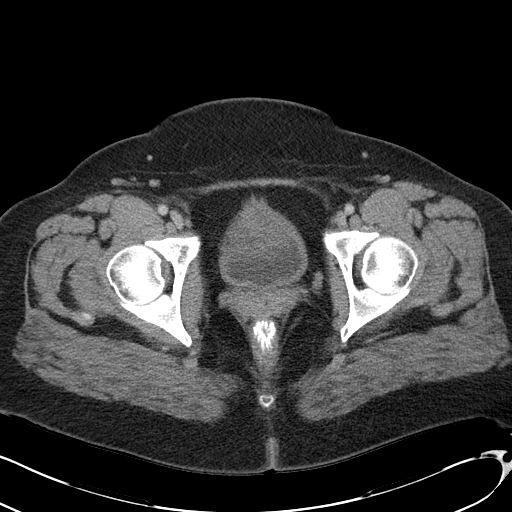
[im 24/97  soft-tissue]
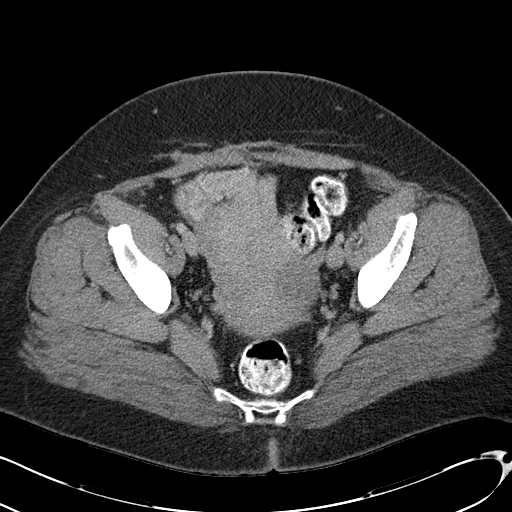
[im 31/97  soft-tissue]
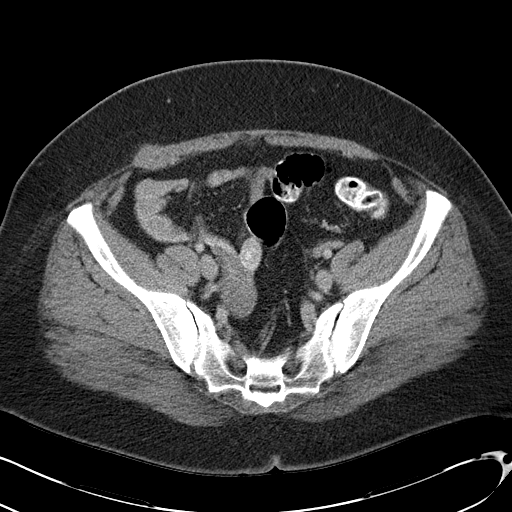
[im 39/97  soft-tissue]
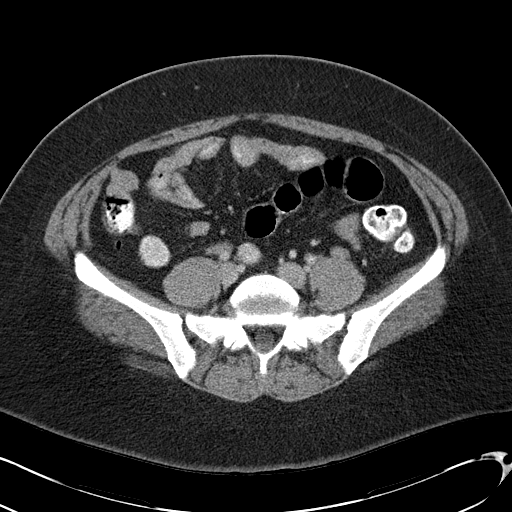
[im 47/97  soft-tissue]
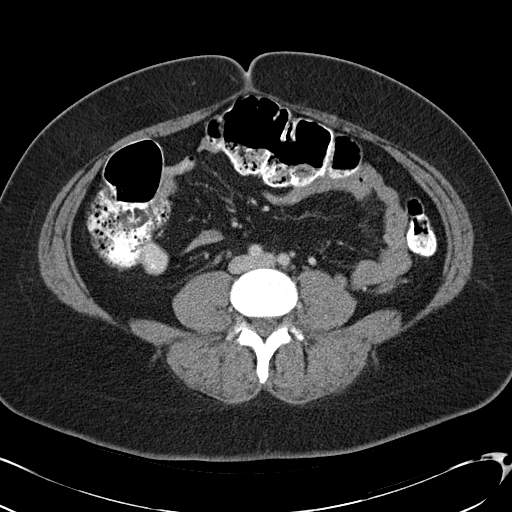
[im 54/97  soft-tissue]
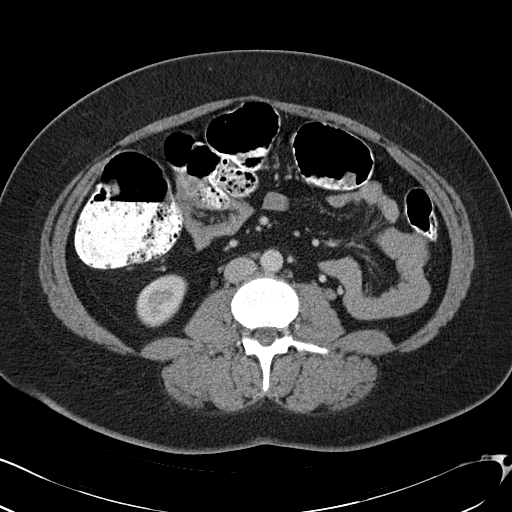
[im 62/97  soft-tissue]
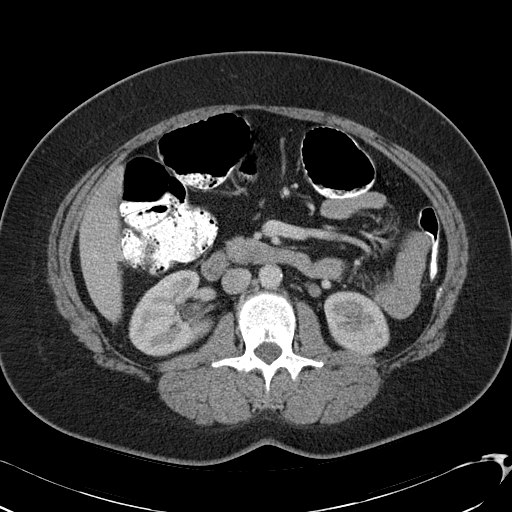
[im 70/97  soft-tissue]
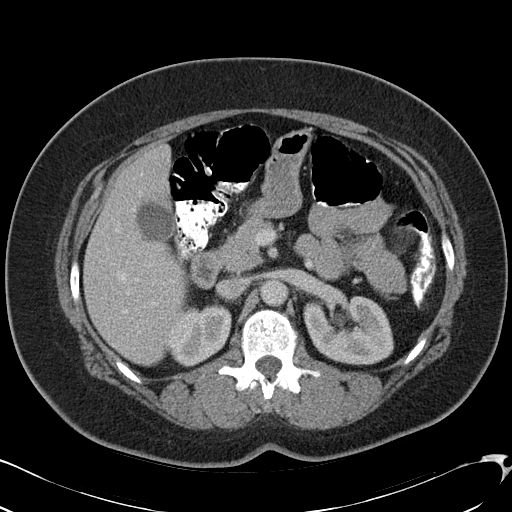
[im 70/97  bone]
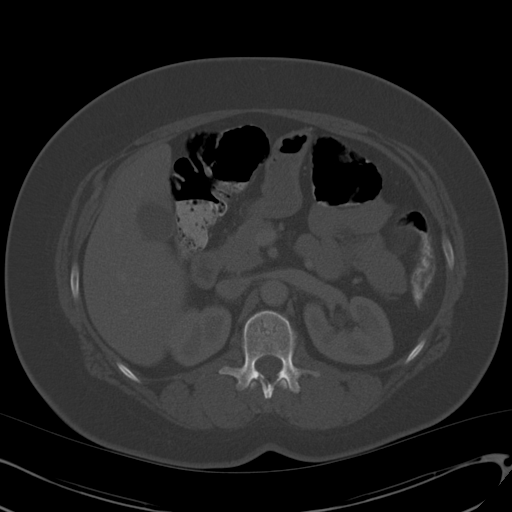
[im 77/97  soft-tissue]
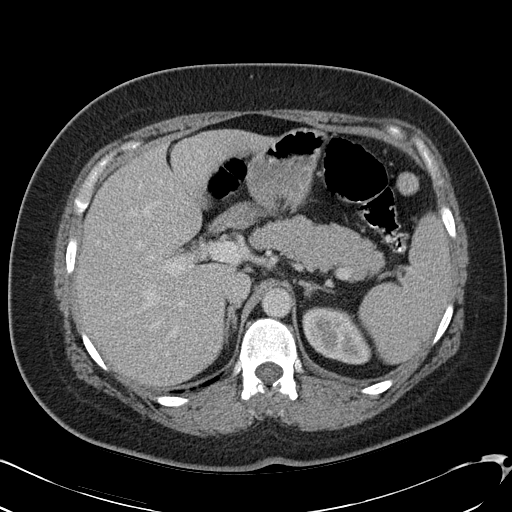
[im 85/97  soft-tissue]
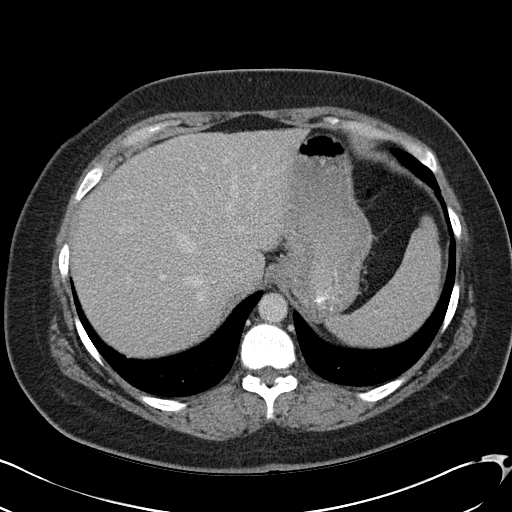
[im 93/97  soft-tissue]
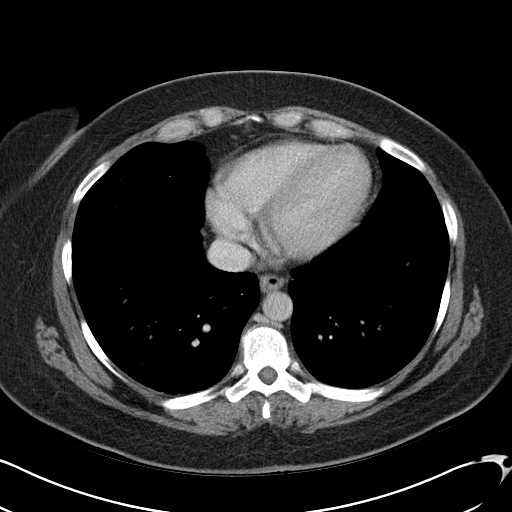

[Series 3: abd_pel_with 3.0 spo cor · coronal · 0.68mm/px · 3 of 93 slices shown]
[im 31/93  soft-tissue]
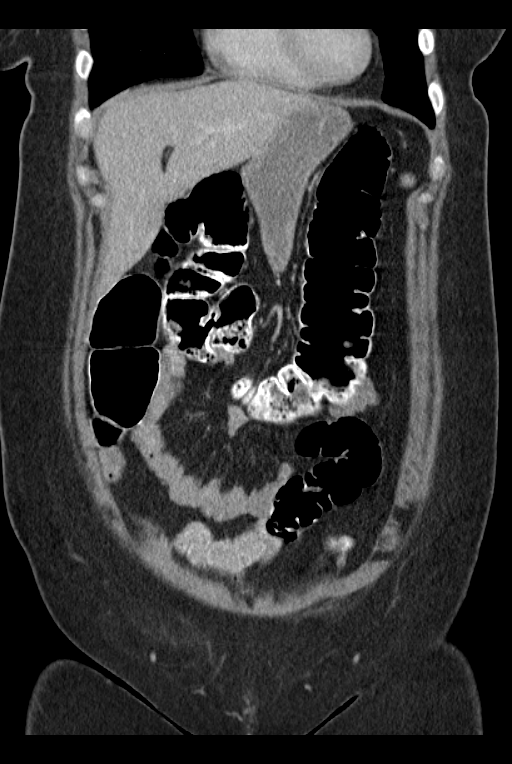
[im 41/93  soft-tissue]
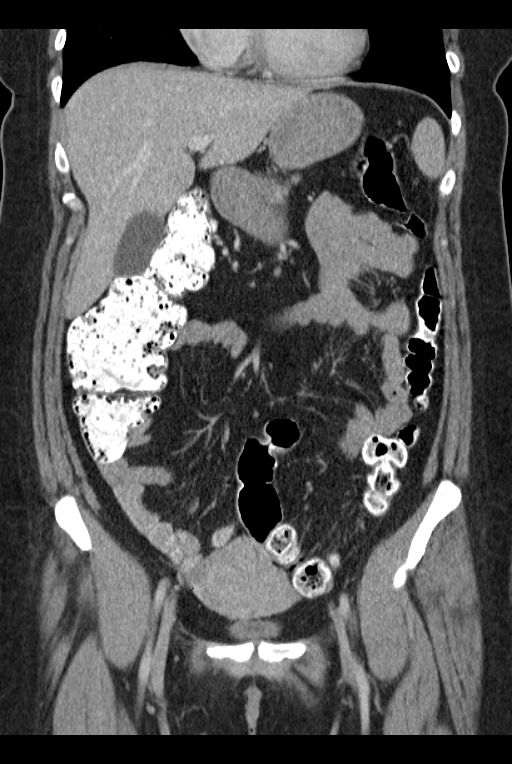
[im 52/93  soft-tissue]
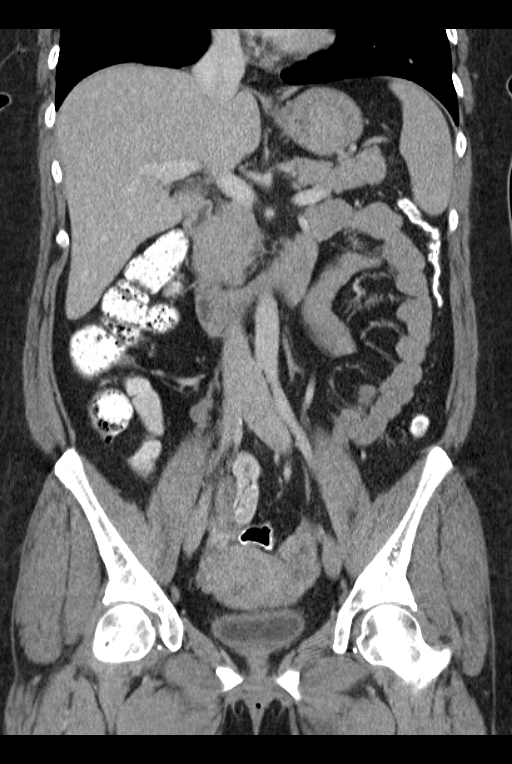

[15 of 46 positions shown; findings below may reference images not displayed]

FINDINGS: Lower chest: Clear lung bases. Normal heart size, without
pericardial or pleural effusion.

Liver:  Normal, without focal lesion.

Spleen: Splenule.

Stomach: Normal, without wall thickening.

Pancreas: Normal, without mass or pancreatic ductal dilatation.

Gallbladder/Biliary Tree: Normal gallbladder, without intra or
extrahepatic biliary ductal dilatation.

Kidneys/Adrenals: Normal adrenal glands. Normal kidneys, without
hydronephrosis.

Bowel loops: Normal colon, appendix, and terminal ileum. Normal
small bowel without abdominal ascites.

Vascular: Mild but age advanced atherosclerosis in the aorta and
common iliac arteries.

Nodes: No retroperitoneal or retrocrural adenopathy. No pelvic
adenopathy.

Pelvic Genitourinary: Normal urinary bladder and uterus. Prior tubal
ligation. No adnexal mass.

Other: No significant free fluid. Nodularity about the anterior
right aspect of the right rectus musculature on image 69. 1.5 x
cm.

Small bilateral Bartholin's gland cyst suspected. 1.3 cm on the
right on image 95.

Bones/Musculoskeletal: No acute osseous abnormality.
IMPRESSION: 1. Normal appendix.
2. Nodularity about the lateral aspect of the right rectus
musculature. This is is lateral and superior to the presumed
cesarian section scar. Considerations include focal scarring,
keloid, or subcutaneous endometriosis.
3. Suspicion of bilateral Bartholin's gland cysts. Correlate with
physical exam.
4. Mild but age advanced atherosclerosis.

## 2015-08-22 ENCOUNTER — Other Ambulatory Visit: Payer: Self-pay | Admitting: Family

## 2015-09-21 ENCOUNTER — Other Ambulatory Visit: Payer: Self-pay | Admitting: Family

## 2015-10-21 ENCOUNTER — Other Ambulatory Visit: Payer: Self-pay | Admitting: Family

## 2016-01-20 ENCOUNTER — Other Ambulatory Visit: Payer: Self-pay | Admitting: Family

## 2016-01-27 ENCOUNTER — Other Ambulatory Visit: Payer: Self-pay | Admitting: Family

## 2016-02-01 ENCOUNTER — Ambulatory Visit (INDEPENDENT_AMBULATORY_CARE_PROVIDER_SITE_OTHER): Payer: 59

## 2016-02-01 ENCOUNTER — Encounter: Payer: Self-pay | Admitting: Pediatrics

## 2016-02-01 ENCOUNTER — Ambulatory Visit (INDEPENDENT_AMBULATORY_CARE_PROVIDER_SITE_OTHER): Payer: 59 | Admitting: Pediatrics

## 2016-02-01 VITALS — BP 113/73 | HR 98 | Temp 97.2°F | Ht 66.5 in | Wt 223.4 lb

## 2016-02-01 DIAGNOSIS — M25522 Pain in left elbow: Secondary | ICD-10-CM

## 2016-02-01 NOTE — Patient Instructions (Signed)
Do not use L arm. Keep in sling.

## 2016-02-01 NOTE — Progress Notes (Signed)
    Subjective:    Patient ID: Wendy Thompson, female    DOB: [birth year], 35 y.o.   MRN: [id number]  CC: Elbow Pain   HPI: Wendy Thompson is a 35 y.o. female presenting for Elbow Pain  Slipped on water on floor last night around 11pm Larey SeatFell forward onto her L arm Thought it was her wrist at first that hurt 30 min later L elbow was throbbing, difficult time sleeping due to pain hasnt taken anything for the pain Now cannot supinate L forearm without pain, straightening, bending L arm limited Bruised knee but otherwise no other injuries   [facility] 02/01/2016 02/10/2015  Decreased Interest 0 1  Down, Depressed, Hopeless 0 1  PHQ - 2 Score 0 2  Altered sleeping - 0  Tired, decreased energy - 0  Change in appetite - 0  Feeling bad or failure about yourself  - 0  Trouble concentrating - 0  Moving slowly or fidgety/restless - 0  Suicidal thoughts - 0  PHQ-9 Score - 2     Relevant past medical, surgical, family and social history reviewed and updated as indicated.  Interim medical history since our last visit reviewed. Allergies and medications reviewed and updated.  ROS: Per HPI unless specifically indicated above     Objective:    BP 113/73 mmHg  Pulse 98  Temp(Src) 97.2 F (36.2 C) (Oral)  Ht 5' 6.5" (1.689 m)  Wt 223 lb 6.4 oz (101.334 kg)  BMI 35.52 kg/m2  Wt Readings from Last 3 Encounters:  02/01/16 223 lb 6.4 oz (101.334 kg)  02/10/15 220 lb (99.791 kg)  03/30/14 213 lb (96.616 kg)     Gen: NAD, alert, cooperative with exam, NCAT EYES: EOMI, no scleral injection or icterus Neuro: Alert and oriented, strength equal b/l UE and LE, coordination grossly normal MSK: L hand swollen, not able to straighten L arm more than apprx 150degrees, can't bend beyond 120 degrees without pain, point tenderness over L lateral condyle. Normal wrist ROM, no point tenderness wrist or hand  X-ray: "IMPRESSION: Left elbow joint effusion. Findings concerning for occult  fracture, possibly radial head/ neck. Consider immobilization and repeat imaging if symptoms persist."    Assessment & Plan:    Wendy Thompson was seen today for elbow pain. Tender over radial head. Will immobilize in sling.   Diagnoses and all orders for this visit:  Elbow pain, left -     DG Elbow 2 Views Left; Future -     Ambulatory referral to Orthopedic Surgery  Follow up plan: Return in about 1 week (around 02/08/2016). Repeat xray at that time if has not yet seen ortho.  Wendy Krasarol Vincent, MD [facility] 02/01/2016, 1:59 PM

## 2016-02-14 ENCOUNTER — Other Ambulatory Visit: Payer: 59 | Admitting: Family

## 2016-02-27 ENCOUNTER — Encounter: Payer: Self-pay | Admitting: Family

## 2016-02-27 ENCOUNTER — Ambulatory Visit (INDEPENDENT_AMBULATORY_CARE_PROVIDER_SITE_OTHER): Payer: 59 | Admitting: Family

## 2016-02-27 VITALS — BP 108/75 | HR 88 | Temp 98.5°F | Ht 66.5 in | Wt 228.8 lb

## 2016-02-27 DIAGNOSIS — Z0000 Encounter for general adult medical examination without abnormal findings: Secondary | ICD-10-CM | POA: Diagnosis not present

## 2016-02-27 DIAGNOSIS — Z01419 Encounter for gynecological examination (general) (routine) without abnormal findings: Secondary | ICD-10-CM | POA: Diagnosis not present

## 2016-02-27 DIAGNOSIS — E785 Hyperlipidemia, unspecified: Secondary | ICD-10-CM

## 2016-02-27 DIAGNOSIS — Z713 Dietary counseling and surveillance: Secondary | ICD-10-CM

## 2016-02-27 DIAGNOSIS — E669 Obesity, unspecified: Secondary | ICD-10-CM | POA: Insufficient documentation

## 2016-02-27 DIAGNOSIS — F411 Generalized anxiety disorder: Secondary | ICD-10-CM

## 2016-02-27 DIAGNOSIS — I10 Essential (primary) hypertension: Secondary | ICD-10-CM

## 2016-02-27 DIAGNOSIS — F17200 Nicotine dependence, unspecified, uncomplicated: Secondary | ICD-10-CM

## 2016-02-27 LAB — URINALYSIS, COMPLETE
BILIRUBIN UA: NEGATIVE
GLUCOSE, UA: NEGATIVE
Ketones, UA: NEGATIVE
Leukocytes, UA: NEGATIVE
NITRITE UA: NEGATIVE
PH UA: 6 (ref 5.0–7.5)
PROTEIN UA: NEGATIVE
RBC, UA: NEGATIVE
Specific Gravity, UA: 1.02 (ref 1.005–1.030)
UUROB: 0.2 mg/dL (ref 0.2–1.0)

## 2016-02-27 LAB — MICROSCOPIC EXAMINATION
Epithelial Cells (non renal): >10 /hpf — AB (ref 0–10)
RBC MICROSCOPIC, UA: NONE SEEN /HPF (ref 0–?)

## 2016-02-27 MED ORDER — PHENTERMINE HCL 37.5 MG PO CAPS: 37.5000 mg | ORAL_CAPSULE | ORAL | 2 refills | Status: DC

## 2016-02-27 NOTE — Patient Instructions (Signed)
Health Maintenance, Female Adopting a healthy lifestyle and getting preventive care can go a long way to promote health and wellness. Talk with your health care provider about what schedule of regular examinations is right for you. This is a good chance for you to check in with your provider about disease prevention and staying healthy. In between checkups, there are plenty of things you can do on your own. Experts have done a lot of research about which lifestyle changes and preventive measures are most likely to keep you healthy. Ask your health care provider for more information. WEIGHT AND DIET  Eat a healthy diet  Be sure to include plenty of vegetables, fruits, low-fat dairy products, and lean protein.  Do not eat a lot of foods high in solid fats, added sugars, or salt.  Get regular exercise. This is one of the most important things you can do for your health.  Most adults should exercise for at least 150 minutes each week. The exercise should increase your heart rate and make you sweat (moderate-intensity exercise).  Most adults should also do strengthening exercises at least twice a week. This is in addition to the moderate-intensity exercise.  Maintain a healthy weight  Body mass index (BMI) is a measurement that can be used to identify possible weight problems. It estimates body fat based on height and weight. Your health care provider can help determine your BMI and help you achieve or maintain a healthy weight.  For females 20 years of age and older:   A BMI below 18.5 is considered underweight.  A BMI of 18.5 to 24.9 is normal.  A BMI of 25 to 29.9 is considered overweight.  A BMI of 30 and above is considered obese.  Watch levels of cholesterol and blood lipids  You should start having your blood tested for lipids and cholesterol at 37 years of age, then have this test every 5 years.  You may need to have your cholesterol levels checked more often if:  Your lipid  or cholesterol levels are high.  You are older than 37 years of age.  You are at high risk for heart disease.  CANCER SCREENING   Lung Cancer  Lung cancer screening is recommended for adults 55-80 years old who are at high risk for lung cancer because of a history of smoking.  A yearly low-dose CT scan of the lungs is recommended for people who:  Currently smoke.  Have quit within the past 15 years.  Have at least a 30-pack-year history of smoking. A pack year is smoking an average of one pack of cigarettes a day for 1 year.  Yearly screening should continue until it has been 15 years since you quit.  Yearly screening should stop if you develop a health problem that would prevent you from having lung cancer treatment.  Breast Cancer  Practice breast self-awareness. This means understanding how your breasts normally appear and feel.  It also means doing regular breast self-exams. Let your health care provider know about any changes, no matter how small.  If you are in your 20s or 30s, you should have a clinical breast exam (CBE) by a health care provider every 1-3 years as part of a regular health exam.  If you are 40 or older, have a CBE every year. Also consider having a breast X-ray (mammogram) every year.  If you have a family history of breast cancer, talk to your health care provider about genetic screening.  If you   are at high risk for breast cancer, talk to your health care provider about having an MRI and a mammogram every year.  Breast cancer gene (BRCA) assessment is recommended for women who have family members with BRCA-related cancers. BRCA-related cancers include:  Breast.  Ovarian.  Tubal.  Peritoneal cancers.  Results of the assessment will determine the need for genetic counseling and BRCA1 and BRCA2 testing. Cervical Cancer Your health care provider may recommend that you be screened regularly for cancer of the pelvic organs (ovaries, uterus, and  vagina). This screening involves a pelvic examination, including checking for microscopic changes to the surface of your cervix (Pap test). You may be encouraged to have this screening done every 3 years, beginning at age 21.  For women ages 30-65, health care providers may recommend pelvic exams and Pap testing every 3 years, or they may recommend the Pap and pelvic exam, combined with testing for human papilloma virus (HPV), every 5 years. Some types of HPV increase your risk of cervical cancer. Testing for HPV may also be done on women of any age with unclear Pap test results.  Other health care providers may not recommend any screening for nonpregnant women who are considered low risk for pelvic cancer and who do not have symptoms. Ask your health care provider if a screening pelvic exam is right for you.  If you have had past treatment for cervical cancer or a condition that could lead to cancer, you need Pap tests and screening for cancer for at least 20 years after your treatment. If Pap tests have been discontinued, your risk factors (such as having a new sexual partner) need to be reassessed to determine if screening should resume. Some women have medical problems that increase the chance of getting cervical cancer. In these cases, your health care provider may recommend more frequent screening and Pap tests. Colorectal Cancer  This type of cancer can be detected and often prevented.  Routine colorectal cancer screening usually begins at 37 years of age and continues through 37 years of age.  Your health care provider may recommend screening at an earlier age if you have risk factors for colon cancer.  Your health care provider may also recommend using home test kits to check for hidden blood in the stool.  A small camera at the end of a tube can be used to examine your colon directly (sigmoidoscopy or colonoscopy). This is done to check for the earliest forms of colorectal  cancer.  Routine screening usually begins at age 50.  Direct examination of the colon should be repeated every 5-10 years through 37 years of age. However, you may need to be screened more often if early forms of precancerous polyps or small growths are found. Skin Cancer  Check your skin from head to toe regularly.  Tell your health care provider about any new moles or changes in moles, especially if there is a change in a mole's shape or color.  Also tell your health care provider if you have a mole that is larger than the size of a pencil eraser.  Always use sunscreen. Apply sunscreen liberally and repeatedly throughout the day.  Protect yourself by wearing long sleeves, pants, a wide-brimmed hat, and sunglasses whenever you are outside. HEART DISEASE, DIABETES, AND HIGH BLOOD PRESSURE   High blood pressure causes heart disease and increases the risk of stroke. High blood pressure is more likely to develop in:  People who have blood pressure in the high end   of the normal range (130-139/85-89 mm Hg).  People who are overweight or obese.  People who are African American.  If you are 38-23 years of age, have your blood pressure checked every 3-5 years. If you are 61 years of age or older, have your blood pressure checked every year. You should have your blood pressure measured twice--once when you are at a hospital or clinic, and once when you are not at a hospital or clinic. Record the average of the two measurements. To check your blood pressure when you are not at a hospital or clinic, you can use:  An automated blood pressure machine at a pharmacy.  A home blood pressure monitor.  If you are between 45 years and 39 years old, ask your health care provider if you should take aspirin to prevent strokes.  Have regular diabetes screenings. This involves taking a blood sample to check your fasting blood sugar level.  If you are at a normal weight and have a low risk for diabetes,  have this test once every three years after 37 years of age.  If you are overweight and have a high risk for diabetes, consider being tested at a younger age or more often. PREVENTING INFECTION  Hepatitis B  If you have a higher risk for hepatitis B, you should be screened for this virus. You are considered at high risk for hepatitis B if:  You were born in a country where hepatitis B is common. Ask your health care provider which countries are considered high risk.  Your parents were born in a high-risk country, and you have not been immunized against hepatitis B (hepatitis B vaccine).  You have HIV or AIDS.  You use needles to inject street drugs.  You live with someone who has hepatitis B.  You have had sex with someone who has hepatitis B.  You get hemodialysis treatment.  You take certain medicines for conditions, including cancer, organ transplantation, and autoimmune conditions. Hepatitis C  Blood testing is recommended for:  Everyone born from [birth year] through 1967.  Anyone with known risk factors for hepatitis C. Sexually transmitted infections (STIs)  You should be screened for sexually transmitted infections (STIs) including gonorrhea and chlamydia if:  You are sexually active and are younger than 37 years of age.  You are older than 37 years of age and your health care provider tells you that you are at risk for this type of infection.  Your sexual activity has changed since you were last screened and you are at an increased risk for chlamydia or gonorrhea. Ask your health care provider if you are at risk.  If you do not have HIV, but are at risk, it may be recommended that you take a prescription medicine daily to prevent HIV infection. This is called pre-exposure prophylaxis (PrEP). You are considered at risk if:  You are sexually active and do not regularly use condoms or know the HIV status of your partner(s).  You take drugs by injection.  You are sexually  active with a partner who has HIV. Talk with your health care provider about whether you are at high risk of being infected with HIV. If you choose to begin PrEP, you should first be tested for HIV. You should then be tested every 3 months for as long as you are taking PrEP.  PREGNANCY   If you are premenopausal and you may become pregnant, ask your health care provider about preconception counseling.  If you may  become pregnant, take 400 to 800 micrograms (mcg) of folic acid every day.  If you want to prevent pregnancy, talk to your health care provider about birth control (contraception). OSTEOPOROSIS AND MENOPAUSE   Osteoporosis is a disease in which the bones lose minerals and strength with aging. This can result in serious bone fractures. Your risk for osteoporosis can be identified using a bone density scan.  If you are 61 years of age or older, or if you are at risk for osteoporosis and fractures, ask your health care provider if you should be screened.  Ask your health care provider whether you should take a calcium or vitamin D supplement to lower your risk for osteoporosis.  Menopause may have certain physical symptoms and risks.  Hormone replacement therapy may reduce some of these symptoms and risks. Talk to your health care provider about whether hormone replacement therapy is right for you.  HOME CARE INSTRUCTIONS   Schedule regular health, dental, and eye exams.  Stay current with your immunizations.   Do not use any tobacco products including cigarettes, chewing tobacco, or electronic cigarettes.  If you are pregnant, do not drink alcohol.  If you are breastfeeding, limit how much and how often you drink alcohol.  Limit alcohol intake to no more than 1 drink per day for nonpregnant women. One drink equals 12 ounces of beer, 5 ounces of wine, or 1 ounces of hard liquor.  Do not use street drugs.  Do not share needles.  Ask your health care provider for help if  you need support or information about quitting drugs.  Tell your health care provider if you often feel depressed.  Tell your health care provider if you have ever been abused or do not feel safe at home.   This information is not intended to replace advice given to you by your health care provider. Make sure you discuss any questions you have with your health care provider.   Document Released: 01/22/2011 Document Revised: 07/30/2014 Document Reviewed: 06/10/2013 Elsevier Interactive Patient Education [employer].

## 2016-02-27 NOTE — Progress Notes (Signed)
Subjective:    Patient ID: Wendy Thompson, female    DOB: 1981-01-01, 35 y.o.   MRN: [id number]  Pt presents to the office today for CPE with pap.  Gynecologic Exam  The patient's pertinent negatives include no genital lesions, genital odor or vaginal bleeding. This is a chronic problem. Associated symptoms include urgency. Pertinent negatives include no headaches.  Hypertension  This is a chronic problem. The current episode started more than 1 year ago. The problem has been resolved since onset. The problem is controlled. Associated symptoms include anxiety. Pertinent negatives include no headaches, malaise/fatigue, palpitations, peripheral edema or shortness of breath. Risk factors for coronary artery disease include obesity. Past treatments include ACE inhibitors and diuretics. The current treatment provides significant improvement. There is no history of kidney disease, CAD/MI, CVA, heart failure or a thyroid problem. There is no history of sleep apnea.  Anxiety  Presents for follow-up visit. The problem has been waxing and waning. Patient reports no depressed mood, excessive worry, nervous/anxious behavior, palpitations or shortness of breath. Symptoms occur rarely. The severity of symptoms is mild. The quality of sleep is good.   Past treatments include lifestyle changes. The treatment provided significant relief. Compliance with prior treatments has been good.  Hyperlipidemia  This is a chronic problem. The current episode started more than 1 year ago. The problem is uncontrolled. Recent lipid tests were reviewed and are high. Exacerbating diseases include obesity. Factors aggravating her hyperlipidemia include smoking. Pertinent negatives include no shortness of breath. Current antihyperlipidemic treatment includes diet change. The current treatment provides no improvement of lipids. Risk factors for coronary artery disease include obesity, dyslipidemia, family history and hypertension.       Review of Systems  Constitutional: Negative.  Negative for malaise/fatigue.  HENT: Negative.   Eyes: Negative.   Respiratory: Negative.  Negative for shortness of breath.   Cardiovascular: Negative.  Negative for palpitations.  Gastrointestinal: Negative.   Endocrine: Negative.   Genitourinary: Positive for urgency.  Musculoskeletal: Negative.   Neurological: Negative.  Negative for headaches.  Hematological: Negative.   Psychiatric/Behavioral: Negative.  The patient is not nervous/anxious.   All other systems reviewed and are negative.      Objective:   Physical Exam  Constitutional: She is oriented to person, place, and time. She appears well-developed and well-nourished. No distress.  HENT:  Head: Normocephalic and atraumatic.  Right Ear: External ear normal.  Mouth/Throat: Oropharynx is clear and moist.  Eyes: Pupils are equal, round, and reactive to light.  Neck: Normal range of motion. Neck supple. No thyromegaly present.  Cardiovascular: Normal rate, regular rhythm, normal heart sounds and intact distal pulses.   No murmur heard. Pulmonary/Chest: Effort normal and breath sounds normal. No respiratory distress. She has no wheezes. Right breast exhibits no inverted nipple, no mass, no nipple discharge, no skin change and no tenderness. Left breast exhibits no inverted nipple, no mass, no nipple discharge, no skin change and no tenderness. Breasts are symmetrical.  Abdominal: Soft. Bowel sounds are normal. She exhibits no distension. There is no tenderness.  Genitourinary: Vagina normal.  Genitourinary Comments: Bimanual exam- no adnexal masses or tenderness, ovaries nonpalpable   Cervix parous and pink- No discharge   Musculoskeletal: Normal range of motion. She exhibits no edema or tenderness.  Neurological: She is alert and oriented to person, place, and time. She has normal reflexes. No cranial nerve deficit.  Skin: Skin is warm and dry.  Psychiatric: She has a  normal mood and affect.  Her behavior is normal. Judgment and thought content normal.  Vitals reviewed.     BP 108/75   Pulse 88   Temp 98.5 F (36.9 C) (Oral)   Ht 5' 6.5" (1.689 m)   Wt 228 lb 12.8 oz (103.8 kg)   LMP 02/17/2016 (Exact Date)   BMI 36.38 kg/m      Assessment & Plan:  1. Encounter for routine gynecological examination - Urinalysis, Complete - CMP14+EGFR - Pap IG w/ reflex to HPV when ASC-U  2. Obesity (BMI 30-39.9) - CMP14+EGFR - phentermine 37.5 MG capsule; Take 1 capsule (37.5 mg total) by mouth every morning.  Dispense: 30 capsule; Refill: 2  3. Current smoker - CMP14+EGFR  4. Essential hypertension, benign - CMP14+EGFR  5. GAD (generalized anxiety disorder) - CMP14+EGFR  6. Hyperlipidemia - CMP14+EGFR - Lipid panel  7. Annual physical exam - CMP14+EGFR - Anemia Profile B - Lipid panel - Thyroid Panel With TSH - VITAMIN D 25 Hydroxy (Vit-D Deficiency, Fractures) - Pap IG w/ reflex to HPV when ASC-U  8. Weight loss counseling, encounter for -Pt started on phentermine today - phentermine 37.5 MG capsule; Take 1 capsule (37.5 mg total) by mouth every morning.  Dispense: 30 capsule; Refill: 2   Continue all meds Labs pending Health Maintenance reviewed Diet and exercise encouraged RTO 3 months to discuss weight loss  Evelina Dun, FNP

## 2016-02-28 LAB — ANEMIA PROFILE B
BASOS ABS: 0 10*3/uL (ref 0.0–0.2)
Basos: 1 %
EOS (ABSOLUTE): 0.2 10*3/uL (ref 0.0–0.4)
Eos: 3 %
FOLATE: 2.9 ng/mL — AB (ref >3.0)
Ferritin: 82 ng/mL (ref 15–150)
HEMOGLOBIN: 13.6 g/dL (ref 11.1–15.9)
Hematocrit: 39 % (ref 34.0–46.6)
IRON: 83 ug/dL (ref 27–159)
Immature Grans (Abs): 0 10*3/uL (ref 0.0–0.1)
Immature Granulocytes: 0 %
Iron Saturation: 31 % (ref 15–55)
LYMPHS ABS: 2 10*3/uL (ref 0.7–3.1)
Lymphs: 29 %
MCH: 30.4 pg (ref 26.6–33.0)
MCHC: 34.9 g/dL (ref 31.5–35.7)
MCV: 87 fL (ref 79–97)
Monocytes Absolute: 0.5 10*3/uL (ref 0.1–0.9)
Monocytes: 7 %
NEUTROS ABS: 4.2 10*3/uL (ref 1.4–7.0)
Neutrophils: 60 %
Platelets: 221 10*3/uL (ref 150–379)
RBC: 4.48 x10E6/uL (ref 3.77–5.28)
RDW: 13.2 % (ref 12.3–15.4)
Retic Ct Pct: 1.2 % (ref 0.6–2.6)
Total Iron Binding Capacity: 269 ug/dL (ref 250–450)
UIBC: 186 ug/dL (ref 131–425)
VITAMIN B 12: 328 pg/mL (ref 211–946)
WBC: 7 10*3/uL (ref 3.4–10.8)

## 2016-02-28 LAB — LIPID PANEL
CHOLESTEROL TOTAL: 199 mg/dL (ref 100–199)
Chol/HDL Ratio: 7.7 ratio units — ABNORMAL HIGH (ref 0.0–4.4)
HDL: 26 mg/dL — ABNORMAL LOW (ref >39)
LDL CALC: 106 mg/dL — AB (ref 0–99)
Triglycerides: 334 mg/dL — ABNORMAL HIGH (ref 0–149)
VLDL Cholesterol Cal: 67 mg/dL — ABNORMAL HIGH (ref 5–40)

## 2016-02-28 LAB — THYROID PANEL WITH TSH
Free Thyroxine Index: 2 (ref 1.2–4.9)
T3 UPTAKE RATIO: 28 % (ref 24–39)
T4 TOTAL: 7.1 ug/dL (ref 4.5–12.0)
TSH: 0.6 u[IU]/mL (ref 0.450–4.500)

## 2016-02-28 LAB — CMP14+EGFR
A/G RATIO: 2 (ref 1.2–2.2)
ALK PHOS: 58 IU/L (ref 39–117)
ALT: 28 IU/L (ref 0–32)
AST: 21 IU/L (ref 0–40)
Albumin: 4.5 g/dL (ref 3.5–5.5)
BUN/Creatinine Ratio: 20 (ref 9–23)
BUN: 16 mg/dL (ref 6–20)
Bilirubin Total: 0.3 mg/dL (ref 0.0–1.2)
CALCIUM: 9.8 mg/dL (ref 8.7–10.2)
CO2: 23 mmol/L (ref 18–29)
Chloride: 98 mmol/L (ref 96–106)
Creatinine, Ser: 0.8 mg/dL (ref 0.57–1.00)
GFR calc Af Amer: 109 mL/min/{1.73_m2} (ref >59)
GFR, EST NON AFRICAN AMERICAN: 94 mL/min/{1.73_m2} (ref >59)
GLOBULIN, TOTAL: 2.2 g/dL (ref 1.5–4.5)
Glucose: 92 mg/dL (ref 65–99)
POTASSIUM: 4.2 mmol/L (ref 3.5–5.2)
SODIUM: 140 mmol/L (ref 134–144)
Total Protein: 6.7 g/dL (ref 6.0–8.5)

## 2016-02-28 LAB — VITAMIN D 25 HYDROXY (VIT D DEFICIENCY, FRACTURES): Vit D, 25-Hydroxy: 26.8 ng/mL — ABNORMAL LOW (ref 30.0–100.0)

## 2016-02-29 LAB — PAP IG W/ RFLX HPV ASCU: PAP Smear Comment: 0

## 2016-03-01 ENCOUNTER — Other Ambulatory Visit: Payer: Self-pay | Admitting: Family

## 2016-03-01 MED ORDER — VITAMIN D (ERGOCALCIFEROL) 1.25 MG (50000 UNIT) PO CAPS: 50000.0000 [IU] | ORAL_CAPSULE | ORAL | 3 refills | Status: DC

## 2016-03-01 MED ORDER — ATORVASTATIN CALCIUM 10 MG PO TABS: 10.0000 mg | ORAL_TABLET | Freq: Every day | ORAL | 1 refills | Status: DC

## 2016-03-02 ENCOUNTER — Telehealth: Payer: Self-pay | Admitting: *Deleted

## 2016-03-02 NOTE — Telephone Encounter (Signed)
-----   Message from Junie Spencerhristy A Hawks, FNP sent at 03/01/2016 12:19 PM EDT ----- Pap negative for lesion or malignancy Kidney and liver function stable Folate low- Start daily folic acid Anemia Profile (Iron levels, Vitamin B12, WBC, Hgb, & Plts)- WNL LDL and Triglycerides elevated- PT needs to be on low fat diet and exercise, Lipitor Prescription sent to pharmacy  Thyroid levels WNL Vit D levels low- Prescription sent to pharmacy

## 2016-04-19 ENCOUNTER — Other Ambulatory Visit: Payer: Self-pay | Admitting: Family

## 2016-04-24 ENCOUNTER — Other Ambulatory Visit: Payer: Self-pay | Admitting: Family

## 2016-05-31 ENCOUNTER — Encounter: Payer: Self-pay | Admitting: Family

## 2016-05-31 ENCOUNTER — Ambulatory Visit (INDEPENDENT_AMBULATORY_CARE_PROVIDER_SITE_OTHER): Payer: 59 | Admitting: Family

## 2016-05-31 VITALS — BP 107/74 | HR 94 | Temp 97.1°F | Ht 66.5 in | Wt 208.8 lb

## 2016-05-31 DIAGNOSIS — I10 Essential (primary) hypertension: Secondary | ICD-10-CM

## 2016-05-31 DIAGNOSIS — E669 Obesity, unspecified: Secondary | ICD-10-CM | POA: Diagnosis not present

## 2016-05-31 DIAGNOSIS — Z713 Dietary counseling and surveillance: Secondary | ICD-10-CM | POA: Diagnosis not present

## 2016-05-31 MED ORDER — PHENTERMINE HCL 37.5 MG PO CAPS: 37.5000 mg | ORAL_CAPSULE | ORAL | 2 refills | Status: DC

## 2016-05-31 NOTE — Patient Instructions (Signed)

## 2016-05-31 NOTE — Progress Notes (Signed)
   Subjective:    Patient ID: Wendy Thompson, female    DOB: March 12, 1981, 35 y.o.   MRN: [id number]  HPI PT presents to the office today to discuss weight loss management. PT was started on phentermine 37.5 mg 3 months ago. Pt states she has lost 20 lb and 4 inches off her hips and 2 inches from waist. PT states she is not currently exercising, but is active throughout the day.    Review of Systems  All other systems reviewed and are negative.      Objective:   Physical Exam  Constitutional: She is oriented to person, place, and time. She appears well-developed and well-nourished. No distress.  HENT:  Head: Normocephalic and atraumatic.  Right Ear: External ear normal.  Left Ear: External ear normal.  Nose: Nose normal.  Mouth/Throat: Oropharynx is clear and moist.  Eyes: Pupils are equal, round, and reactive to light.  Neck: Normal range of motion. Neck supple. No thyromegaly present.  Cardiovascular: Normal rate, regular rhythm, normal heart sounds and intact distal pulses.   No murmur heard. Pulmonary/Chest: Effort normal and breath sounds normal. No respiratory distress. She has no wheezes.  Abdominal: Soft. Bowel sounds are normal. She exhibits no distension. There is no tenderness.  Musculoskeletal: Normal range of motion. She exhibits no edema or tenderness.  Neurological: She is alert and oriented to person, place, and time. She has normal reflexes. No cranial nerve deficit.  Skin: Skin is warm and dry.  Psychiatric: She has a normal mood and affect. Her behavior is normal. Judgment and thought content normal.  Vitals reviewed.     BP 107/74   Pulse 94   Temp 97.1 F (36.2 C) (Oral)   Ht 5' 6.5" (1.689 m)   Wt 208 lb 12.8 oz (94.7 kg)   BMI 33.20 kg/m      Assessment & Plan:  1. Essential hypertension, benign - CMP14+EGFR - phentermine 37.5 MG capsule; Take 1 capsule (37.5 mg total) by mouth every morning.  Dispense: 30 capsule; Refill: 2  2. Obesity (BMI  30-39.9) - CMP14+EGFR - phentermine 37.5 MG capsule; Take 1 capsule (37.5 mg total) by mouth every morning.  Dispense: 30 capsule; Refill: 2  3. Weight loss counseling, encounter for -Discussed diet and exercise -Continue healthy diet -RTO 3 months - CMP14+EGFR - phentermine 37.5 MG capsule; Take 1 capsule (37.5 mg total) by mouth every morning.  Dispense: 30 capsule; Refill: [place], FNP

## 2016-06-01 LAB — CMP14+EGFR
A/G RATIO: 1.8 (ref 1.2–2.2)
ALBUMIN: 4.6 g/dL (ref 3.5–5.5)
ALT: 19 IU/L (ref 0–32)
AST: 17 IU/L (ref 0–40)
Alkaline Phosphatase: 58 IU/L (ref 39–117)
BILIRUBIN TOTAL: 0.5 mg/dL (ref 0.0–1.2)
BUN / CREAT RATIO: 14 (ref 9–23)
BUN: 12 mg/dL (ref 6–20)
CALCIUM: 9.6 mg/dL (ref 8.7–10.2)
CHLORIDE: 98 mmol/L (ref 96–106)
CO2: 26 mmol/L (ref 18–29)
Creatinine, Ser: 0.85 mg/dL (ref 0.57–1.00)
GFR, EST AFRICAN AMERICAN: 101 mL/min/{1.73_m2} (ref >59)
GFR, EST NON AFRICAN AMERICAN: 88 mL/min/{1.73_m2} (ref >59)
Globulin, Total: 2.5 g/dL (ref 1.5–4.5)
Glucose: 86 mg/dL (ref 65–99)
POTASSIUM: 4.2 mmol/L (ref 3.5–5.2)
Sodium: 139 mmol/L (ref 134–144)
TOTAL PROTEIN: 7.1 g/dL (ref 6.0–8.5)

## 2016-08-03 ENCOUNTER — Other Ambulatory Visit: Payer: Self-pay | Admitting: Family

## 2016-08-03 NOTE — Telephone Encounter (Signed)
Rx called in 

## 2016-08-24 ENCOUNTER — Other Ambulatory Visit: Payer: Self-pay | Admitting: Family

## 2016-09-11 ENCOUNTER — Ambulatory Visit: Payer: 59 | Admitting: Family

## 2016-09-19 ENCOUNTER — Other Ambulatory Visit: Payer: Self-pay | Admitting: Family

## 2016-10-18 ENCOUNTER — Other Ambulatory Visit: Payer: Self-pay | Admitting: Family

## 2016-10-24 ENCOUNTER — Other Ambulatory Visit: Payer: Self-pay | Admitting: Family

## 2016-11-19 ENCOUNTER — Other Ambulatory Visit: Payer: Self-pay | Admitting: Family

## 2017-01-22 ENCOUNTER — Encounter: Payer: Self-pay | Admitting: Family

## 2017-01-22 ENCOUNTER — Ambulatory Visit (INDEPENDENT_AMBULATORY_CARE_PROVIDER_SITE_OTHER): Payer: 59 | Admitting: Family

## 2017-01-22 ENCOUNTER — Other Ambulatory Visit: Payer: Self-pay | Admitting: Family

## 2017-01-22 VITALS — BP 109/75 | HR 97 | Temp 98.2°F | Ht 66.5 in | Wt 222.4 lb

## 2017-01-22 DIAGNOSIS — Z713 Dietary counseling and surveillance: Secondary | ICD-10-CM

## 2017-01-22 DIAGNOSIS — E785 Hyperlipidemia, unspecified: Secondary | ICD-10-CM | POA: Diagnosis not present

## 2017-01-22 DIAGNOSIS — I10 Essential (primary) hypertension: Secondary | ICD-10-CM | POA: Diagnosis not present

## 2017-01-22 DIAGNOSIS — Z720 Tobacco use: Secondary | ICD-10-CM

## 2017-01-22 DIAGNOSIS — F411 Generalized anxiety disorder: Secondary | ICD-10-CM | POA: Diagnosis not present

## 2017-01-22 DIAGNOSIS — Z716 Tobacco abuse counseling: Secondary | ICD-10-CM

## 2017-01-22 DIAGNOSIS — E669 Obesity, unspecified: Secondary | ICD-10-CM | POA: Diagnosis not present

## 2017-01-22 DIAGNOSIS — F17200 Nicotine dependence, unspecified, uncomplicated: Secondary | ICD-10-CM

## 2017-01-22 MED ORDER — VARENICLINE TARTRATE 1 MG PO TABS: 1.0000 mg | ORAL_TABLET | Freq: Two times a day (BID) | ORAL | 2 refills | Status: DC

## 2017-01-22 MED ORDER — LISINOPRIL 20 MG PO TABS: ORAL_TABLET | ORAL | 3 refills | Status: DC

## 2017-01-22 MED ORDER — HYDROCHLOROTHIAZIDE 12.5 MG PO CAPS: ORAL_CAPSULE | ORAL | 3 refills | Status: DC

## 2017-01-22 MED ORDER — VARENICLINE TARTRATE 0.5 MG X 11 & 1 MG X 42 PO MISC: ORAL | 0 refills | Status: DC

## 2017-01-22 MED ORDER — PHENTERMINE HCL 37.5 MG PO CAPS
37.5000 mg | ORAL_CAPSULE | ORAL | 2 refills | Status: DC
Start: 2017-01-22 — End: 2018-08-01

## 2017-01-22 NOTE — Progress Notes (Signed)
Subjective:    Patient ID: Wendy Thompson, female    DOB: 01-15-1981, 36 y.o.   MRN: [id number]  Pt presents to the office today for chronic follow up. PT has not taken phentermine since February. Pt states she has lost 20 lb, but has gained 15 lbs back and would like to restart this. She would also like to start chantix today.  Hypertension  This is a chronic problem. The current episode started more than 1 year ago. The problem has been resolved since onset. The problem is controlled. Associated symptoms include anxiety. Pertinent negatives include no malaise/fatigue, peripheral edema or shortness of breath. Risk factors for coronary artery disease include dyslipidemia, obesity, family history and sedentary lifestyle. The current treatment provides moderate improvement. There is no history of kidney disease, CAD/MI, CVA or heart failure.  Anxiety  Presents for follow-up visit. Patient reports no decreased concentration, excessive worry, irritability, nervous/anxious behavior or shortness of breath. Symptoms occur occasionally.    Hyperlipidemia  This is a chronic problem. The current episode started more than 1 year ago. The problem is uncontrolled. Recent lipid tests were reviewed and are high. Exacerbating diseases include obesity. Pertinent negatives include no shortness of breath. Current antihyperlipidemic treatment includes diet change. The current treatment provides mild improvement of lipids. Risk factors for coronary artery disease include dyslipidemia, family history and hypertension.      Review of Systems  Constitutional: Negative for irritability and malaise/fatigue.  Respiratory: Negative for shortness of breath.   Psychiatric/Behavioral: Negative for decreased concentration. The patient is not nervous/anxious.   All other systems reviewed and are negative.      Objective:   Physical Exam  Constitutional: She is oriented to person, place, and time. She appears  well-developed and well-nourished. No distress.  HENT:  Head: Normocephalic and atraumatic.  Right Ear: External ear normal.  Left Ear: External ear normal.  Nose: Nose normal.  Mouth/Throat: Oropharynx is clear and moist.  Eyes: Pupils are equal, round, and reactive to light.  Neck: Normal range of motion. Neck supple. No thyromegaly present.  Cardiovascular: Normal rate, regular rhythm, normal heart sounds and intact distal pulses.   No murmur heard. Pulmonary/Chest: Effort normal and breath sounds normal. No respiratory distress. She has no wheezes.  Abdominal: Soft. Bowel sounds are normal. She exhibits no distension. There is no tenderness.  Musculoskeletal: Normal range of motion. She exhibits no edema or tenderness.  Neurological: She is alert and oriented to person, place, and time.  Skin: Skin is warm and dry.  Psychiatric: She has a normal mood and affect. Her behavior is normal. Judgment and thought content normal.  Vitals reviewed.    BP 109/75   Pulse 97   Temp 98.2 F (36.8 C) (Oral)   Ht 5' 6.5" (1.689 m)   Wt 222 lb 6.4 oz (100.9 kg)   BMI 35.36 kg/m      Assessment & Plan:  1. Essential hypertension, benign -If BP is same or lower next time will d/c HCTZ - hydrochlorothiazide (MICROZIDE) 12.5 MG capsule; TAKE ONE (1) CAPSULE EACH DAY  Dispense: 90 capsule; Refill: 3 - phentermine 37.5 MG capsule; Take 1 capsule (37.5 mg total) by mouth every morning.  Dispense: 30 capsule; Refill: 2 - lisinopril (PRINIVIL,ZESTRIL) 20 MG tablet; TAKE ONE (1) TABLET EACH DAY  Dispense: 30 tablet; Refill: 3 - CMP14+EGFR  2. Obesity (BMI 30-39.9) - phentermine 37.5 MG capsule; Take 1 capsule (37.5 mg total) by mouth every morning.  Dispense: 30 capsule;  Refill: 2 - CMP14+EGFR  3. Weight loss counseling, encounter for Reordered phentermine today - phentermine 37.5 MG capsule; Take 1 capsule (37.5 mg total) by mouth every morning.  Dispense: 30 capsule; Refill: 2 -  CMP14+EGFR  4. Hyperlipidemia, unspecified hyperlipidemia type - CMP14+EGFR  5. Current smoker - CMP14+EGFR - varenicline (CHANTIX STARTING MONTH PAK) 0.5 MG X 11 & 1 MG X 42 tablet; Take one 0.5 mg tablet by mouth once daily for 3 days, then increase to one 0.5 mg tablet twice daily for 4 days, then increase to one 1 mg tablet twice daily.  Dispense: 53 tablet; Refill: 0 - varenicline (CHANTIX CONTINUING MONTH PAK) 1 MG tablet; Take 1 tablet (1 mg total) by mouth 2 (two) times daily.  Dispense: 60 tablet; Refill: 2  6. GAD (generalized anxiety disorder) - CMP14+EGFR  7. Encounter for smoking cessation counseling Chantix ordered today Quit within 1-3 weeks within starting Stop if having any SI  - varenicline (CHANTIX STARTING MONTH PAK) 0.5 MG X 11 & 1 MG X 42 tablet; Take one 0.5 mg tablet by mouth once daily for 3 days, then increase to one 0.5 mg tablet twice daily for 4 days, then increase to one 1 mg tablet twice daily.  Dispense: 53 tablet; Refill: 0 - varenicline (CHANTIX CONTINUING MONTH PAK) 1 MG tablet; Take 1 tablet (1 mg total) by mouth 2 (two) times daily.  Dispense: 60 tablet; Refill: 2   Continue all meds Labs pending Health Maintenance reviewed Diet and exercise encouraged RTO 3 months for weight loss  Evelina Dun, FNP

## 2017-01-22 NOTE — Patient Instructions (Signed)
Exercising to Lose Weight Exercising can help you to lose weight. In order to lose weight through exercise, you need to do vigorous-intensity exercise. You can tell that you are exercising with vigorous intensity if you are breathing very hard and fast and cannot hold a conversation while exercising. Moderate-intensity exercise helps to maintain your current weight. You can tell that you are exercising at a moderate level if you have a higher heart rate and faster breathing, but you are still able to hold a conversation. How often should I exercise? Choose an activity that you enjoy and set realistic goals. Your health care provider can help you to make an activity plan that works for you. Exercise regularly as directed by your health care provider. This may include:  Doing resistance training twice each week, such as: ? Push-ups. ? Sit-ups. ? Lifting weights. ? Using resistance bands.  Doing a given intensity of exercise for a given amount of time. Choose from these options: ? 150 minutes of moderate-intensity exercise every week. ? 75 minutes of vigorous-intensity exercise every week. ? A mix of moderate-intensity and vigorous-intensity exercise every week.  Children, pregnant women, people who are out of shape, people who are overweight, and older adults may need to consult a health care provider for individual recommendations. If you have any sort of medical condition, be sure to consult your health care provider before starting a new exercise program. What are some activities that can help me to lose weight?  Walking at a rate of at least 4.5 miles an hour.  Jogging or running at a rate of 5 miles per hour.  Biking at a rate of at least 10 miles per hour.  Lap swimming.  Roller-skating or in-line skating.  Cross-country skiing.  Vigorous competitive sports, such as football, basketball, and soccer.  Jumping rope.  Aerobic dancing. How can I be more active in my day-to-day  activities?  Use the stairs instead of the elevator.  Take a walk during your lunch break.  If you drive, park your car farther away from work or school.  If you take public transportation, get off one stop early and walk the rest of the way.  Make all of your phone calls while standing up and walking around.  Get up, stretch, and walk around every 30 minutes throughout the day. What guidelines should I follow while exercising?  Do not exercise so much that you hurt yourself, feel dizzy, or get very short of breath.  Consult your health care provider prior to starting a new exercise program.  Wear comfortable clothes and shoes with good support.  Drink plenty of water while you exercise to prevent dehydration or heat stroke. Body water is lost during exercise and must be replaced.  Work out until you breathe faster and your heart beats faster. This information is not intended to replace advice given to you by your health care provider. Make sure you discuss any questions you have with your health care provider. Document Released: 08/11/2010 Document Revised: 12/15/2015 Document Reviewed: 12/10/2013 Elsevier Interactive Patient Education  2018 [employer]  

## 2017-01-23 LAB — CMP14+EGFR
ALBUMIN: 4.5 g/dL (ref 3.5–5.5)
ALK PHOS: 60 IU/L (ref 39–117)
ALT: 26 IU/L (ref 0–32)
AST: 20 IU/L (ref 0–40)
Albumin/Globulin Ratio: 2 (ref 1.2–2.2)
BUN / CREAT RATIO: 18 (ref 9–23)
BUN: 14 mg/dL (ref 6–20)
Bilirubin Total: 0.4 mg/dL (ref 0.0–1.2)
CALCIUM: 9.9 mg/dL (ref 8.7–10.2)
CO2: 23 mmol/L (ref 20–29)
CREATININE: 0.8 mg/dL (ref 0.57–1.00)
Chloride: 99 mmol/L (ref 96–106)
GFR calc Af Amer: 108 mL/min/{1.73_m2} (ref >59)
GFR, EST NON AFRICAN AMERICAN: 94 mL/min/{1.73_m2} (ref >59)
GLUCOSE: 89 mg/dL (ref 65–99)
Globulin, Total: 2.3 g/dL (ref 1.5–4.5)
Potassium: 4 mmol/L (ref 3.5–5.2)
Sodium: 140 mmol/L (ref 134–144)
Total Protein: 6.8 g/dL (ref 6.0–8.5)

## 2017-03-26 IMAGING — DX DG ELBOW 2V*L*
2 series · 2 of 2 positions shown · non-contrast
Comparison: None.

CLINICAL DATA: Fall last night.  Left elbow and humerus pain.

EXAM:
LEFT ELBOW - 2 VIEW

[elbow ap]
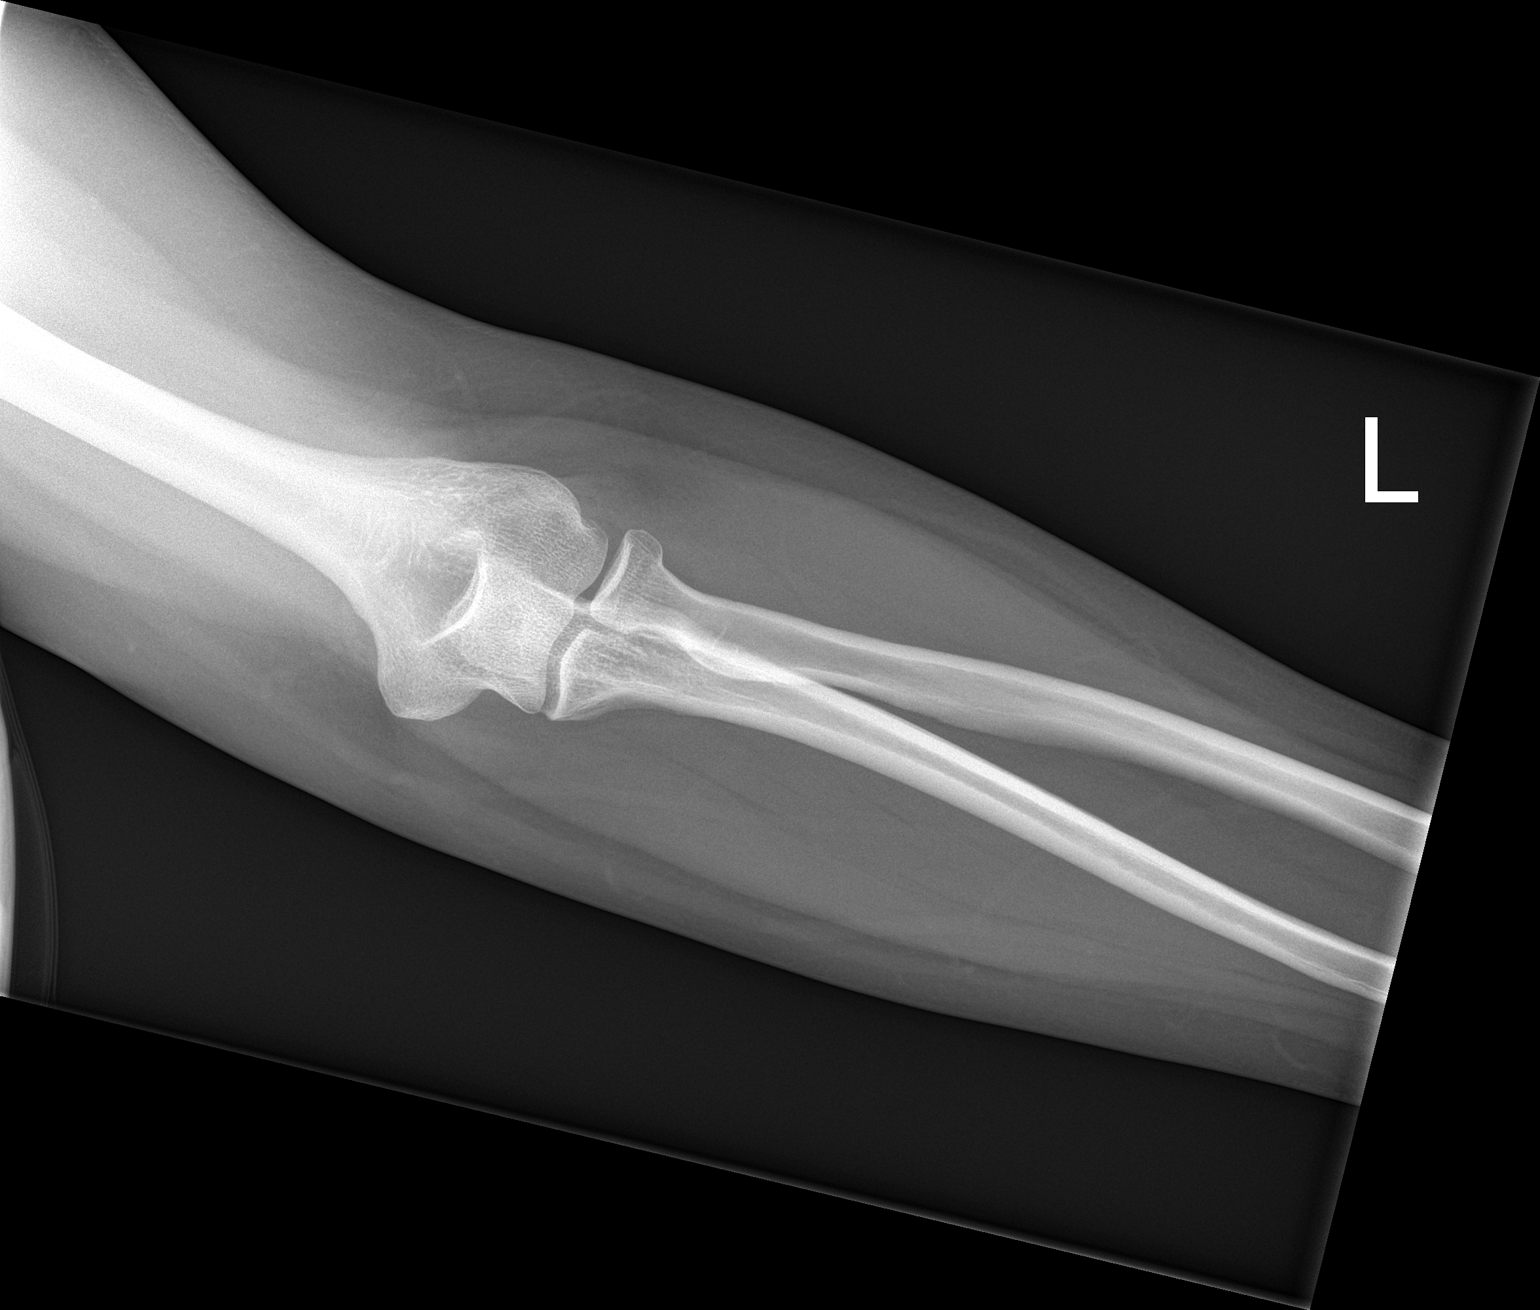

[elbow lat]
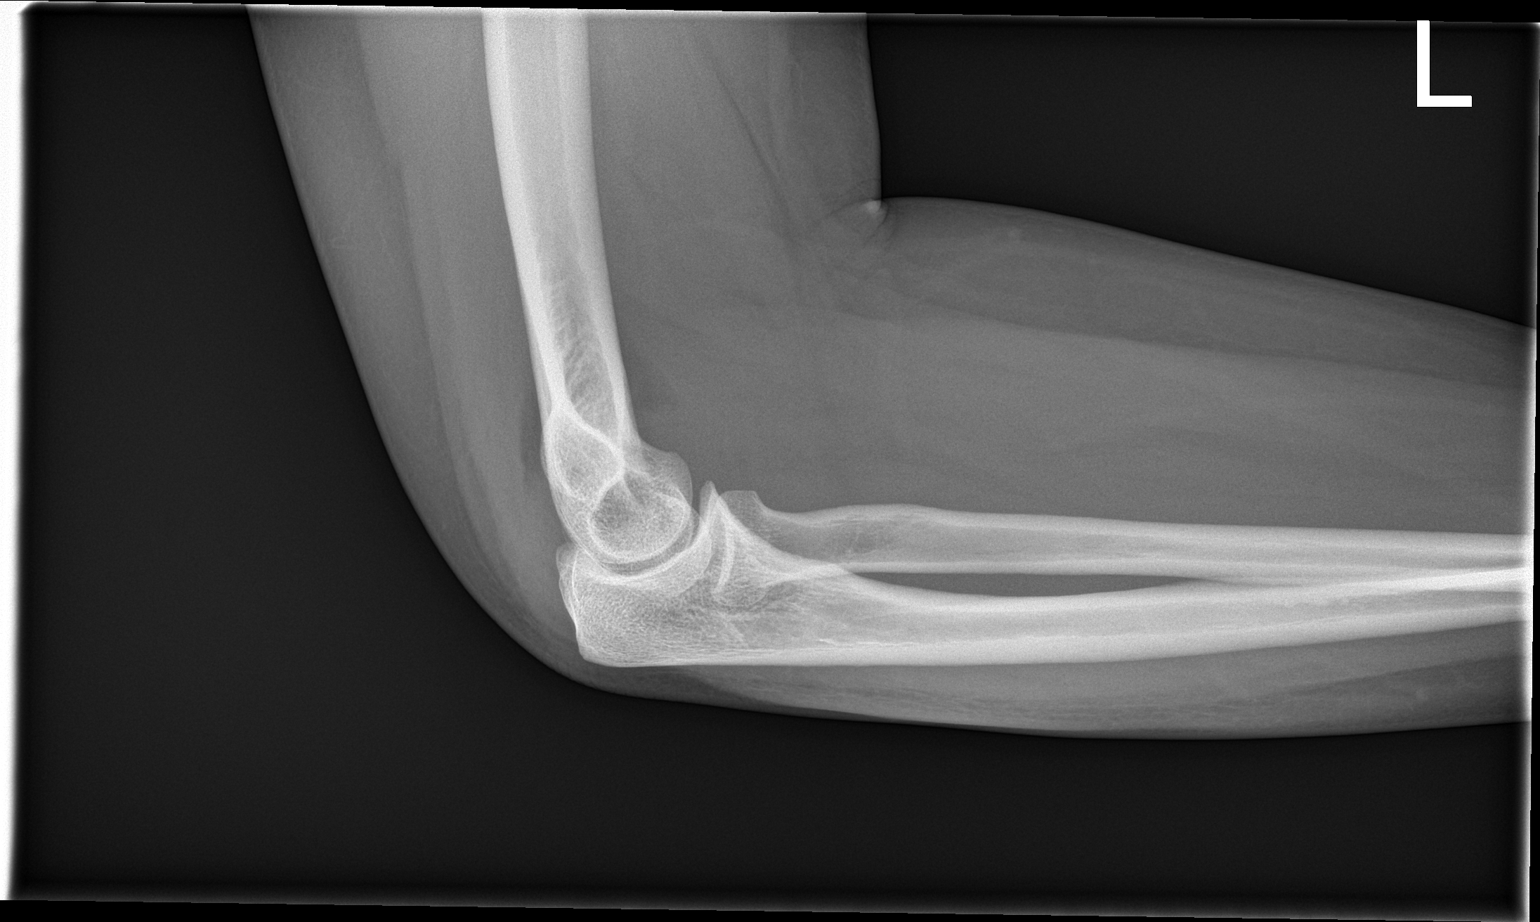

[2 of 2 positions shown; findings below may reference images not displayed]

FINDINGS: There is a left elbow joint effusion. No visible fracture, but
findings are concerning for possible occult fracture. No subluxation
or dislocation.
IMPRESSION: Left elbow joint effusion. Findings concerning for occult fracture,
possibly radial head/ neck. Consider immobilization and repeat
imaging if symptoms persist.

## 2017-04-25 ENCOUNTER — Ambulatory Visit: Payer: 59 | Admitting: Family

## 2018-08-01 ENCOUNTER — Encounter: Payer: Self-pay | Admitting: Family

## 2018-08-01 ENCOUNTER — Ambulatory Visit: Payer: 59 | Admitting: Family

## 2018-08-01 VITALS — BP 187/119 | HR 87 | Temp 98.1°F | Ht 66.5 in | Wt 245.6 lb

## 2018-08-01 DIAGNOSIS — E669 Obesity, unspecified: Secondary | ICD-10-CM | POA: Diagnosis not present

## 2018-08-01 DIAGNOSIS — I10 Essential (primary) hypertension: Secondary | ICD-10-CM

## 2018-08-01 DIAGNOSIS — F17200 Nicotine dependence, unspecified, uncomplicated: Secondary | ICD-10-CM

## 2018-08-01 MED ORDER — LISINOPRIL-HYDROCHLOROTHIAZIDE 20-12.5 MG PO TABS: 1.0000 | ORAL_TABLET | Freq: Every day | ORAL | 3 refills | Status: DC

## 2018-08-01 NOTE — Patient Instructions (Signed)

## 2018-08-01 NOTE — Progress Notes (Signed)
   Subjective:    Patient ID: Wendy Thompson, female    DOB: 1981/01/22, 38 y.o.   MRN: [id number]  Chief Complaint  Patient presents with  . Hypertension   PT presents to the office today with elevated BP. She states she has not taken any of her medications in over a year because she had lost weight and her BP was at goal. She states she has gained 25 lb over the last year and noticed a headache for a month and took her BP this week and noticed her BP was elevated.  Hypertension  This is a chronic problem. The current episode started more than 1 year ago. The problem has been waxing and waning since onset. The problem is uncontrolled. Associated symptoms include headaches ("some times") and malaise/fatigue. Pertinent negatives include no peripheral edema or shortness of breath. Past treatments include nothing. The current treatment provides no improvement.      Review of Systems  Constitutional: Positive for malaise/fatigue.  Respiratory: Negative for shortness of breath.   Neurological: Positive for headaches ("some times").  All other systems reviewed and are negative.      Objective:   Physical Exam Vitals signs reviewed.  Constitutional:      General: She is not in acute distress.    Appearance: She is well-developed.  HENT:     Head: Normocephalic and atraumatic.  Eyes:     Pupils: Pupils are equal, round, and reactive to light.  Neck:     Musculoskeletal: Normal range of motion and neck supple.     Thyroid: No thyromegaly.  Cardiovascular:     Rate and Rhythm: Normal rate and regular rhythm.     Heart sounds: Normal heart sounds. No murmur.  Pulmonary:     Effort: Pulmonary effort is normal. No respiratory distress.     Breath sounds: Normal breath sounds. No wheezing.  Abdominal:     General: Bowel sounds are normal. There is no distension.     Palpations: Abdomen is soft.     Tenderness: There is no abdominal tenderness.  Musculoskeletal: Normal range of motion.         General: No tenderness.  Skin:    General: Skin is warm and dry.  Neurological:     Mental Status: She is alert and oriented to person, place, and time.     Cranial Nerves: No cranial nerve deficit.     Deep Tendon Reflexes: Reflexes are normal and symmetric.  Psychiatric:        Behavior: Behavior normal.        Thought Content: Thought content normal.        Judgment: Judgment normal.       BP (!) 187/119   Pulse 87   Temp 98.1 F (36.7 C) (Oral)   Ht 5' 6.5" (1.689 m)   Wt 245 lb 9.6 oz (111.4 kg)   BMI 39.05 kg/m      Assessment & Plan:  Wendy Thompson comes in today with chief complaint of Hypertension   Diagnosis and orders addressed:  1. Essential hypertension Will restart lisinopril-HCTZ 20-12.5 mg -Dash diet information given -Exercise encouraged - Stress Management  -Continue current meds -RTO in 2 weeks  - lisinopril-hydrochlorothiazide (ZESTORETIC) 20-12.5 MG tablet; Take 1 tablet by mouth daily.  Dispense: 90 tablet; Refill: 3 - CMP14+EGFR  2. Obesity (BMI 30-39.9)  3. Current smoker Smoking cessation discussed   Evelina Dun, FNP

## 2018-08-02 LAB — CMP14+EGFR
ALT: 27 IU/L (ref 0–32)
AST: 17 IU/L (ref 0–40)
Albumin/Globulin Ratio: 2 (ref 1.2–2.2)
Albumin: 4.5 g/dL (ref 3.5–5.5)
Alkaline Phosphatase: 68 IU/L (ref 39–117)
BUN/Creatinine Ratio: 8 — ABNORMAL LOW (ref 9–23)
BUN: 7 mg/dL (ref 6–20)
Bilirubin Total: 0.3 mg/dL (ref 0.0–1.2)
CO2: 23 mmol/L (ref 20–29)
Calcium: 9.5 mg/dL (ref 8.7–10.2)
Chloride: 102 mmol/L (ref 96–106)
Creatinine, Ser: 0.83 mg/dL (ref 0.57–1.00)
GFR calc Af Amer: 103 mL/min/{1.73_m2} (ref >59)
GFR calc non Af Amer: 89 mL/min/{1.73_m2} (ref >59)
GLUCOSE: 76 mg/dL (ref 65–99)
Globulin, Total: 2.2 g/dL (ref 1.5–4.5)
Potassium: 3.9 mmol/L (ref 3.5–5.2)
Sodium: 138 mmol/L (ref 134–144)
Total Protein: 6.7 g/dL (ref 6.0–8.5)

## 2018-08-18 ENCOUNTER — Ambulatory Visit: Payer: 59 | Admitting: Family

## 2018-08-18 ENCOUNTER — Encounter: Payer: Self-pay | Admitting: Family

## 2018-08-18 VITALS — BP 124/82 | HR 98 | Temp 97.6°F | Ht 66.5 in | Wt 241.0 lb

## 2018-08-18 DIAGNOSIS — I10 Essential (primary) hypertension: Secondary | ICD-10-CM

## 2018-08-18 DIAGNOSIS — F17200 Nicotine dependence, unspecified, uncomplicated: Secondary | ICD-10-CM

## 2018-08-18 NOTE — Progress Notes (Signed)
   Subjective:    Patient ID: Wendy Thompson, female    DOB: 1980/12/01, 38 y.o.   MRN: [id number]  No chief complaint on file.  PT presents to the office today to recheck HTN. PT's BP is at goal today.  Hypertension  This is a recurrent problem. The current episode started more than 1 year ago. The problem has been resolved since onset. The problem is controlled. Pertinent negatives include no malaise/fatigue, peripheral edema or shortness of breath. The current treatment provides moderate improvement.      Review of Systems  Constitutional: Negative for malaise/fatigue.  Respiratory: Negative for shortness of breath.   All other systems reviewed and are negative.      Objective:   Physical Exam Vitals signs reviewed.  Constitutional:      General: She is not in acute distress.    Appearance: She is well-developed.  HENT:     Head: Normocephalic and atraumatic.  Eyes:     Pupils: Pupils are equal, round, and reactive to light.  Neck:     Musculoskeletal: Normal range of motion and neck supple.     Thyroid: No thyromegaly.  Cardiovascular:     Rate and Rhythm: Normal rate and regular rhythm.     Heart sounds: Normal heart sounds. No murmur.  Pulmonary:     Effort: Pulmonary effort is normal. No respiratory distress.     Breath sounds: Normal breath sounds. No wheezing.  Abdominal:     General: Bowel sounds are normal. There is no distension.     Palpations: Abdomen is soft.     Tenderness: There is no abdominal tenderness.  Musculoskeletal: Normal range of motion.        General: No tenderness.  Skin:    General: Skin is warm and dry.  Neurological:     Mental Status: She is alert and oriented to person, place, and time.     Cranial Nerves: No cranial nerve deficit.     Deep Tendon Reflexes: Reflexes are normal and symmetric.  Psychiatric:        Behavior: Behavior normal.        Thought Content: Thought content normal.        Judgment: Judgment normal.        BP 124/82   Pulse 98   Temp 97.6 F (36.4 C) (Oral)   Ht 5' 6.5" (1.689 m)   Wt 241 lb (109.3 kg)   BMI 38.32 kg/m      Assessment & Plan:  Wendy Thompson comes in today with chief complaint of No chief complaint on file.   Diagnosis and orders addressed:  1. Essential hypertension, benign -Daily blood pressure log given with instructions on how to fill out and told to bring to next visit -Dash diet information given -Exercise encouraged - Stress Management  -Continue current meds -RTO in 1 year  - BMP8+EGFR  2. Current smoker Smoking cessation discussed  - BMP8+EGFR  Evelina Dun, FNP

## 2018-08-18 NOTE — Patient Instructions (Signed)

## 2018-08-19 LAB — BMP8+EGFR
BUN/Creatinine Ratio: 13 (ref 9–23)
BUN: 12 mg/dL (ref 6–20)
CHLORIDE: 102 mmol/L (ref 96–106)
CO2: 19 mmol/L — ABNORMAL LOW (ref 20–29)
Calcium: 10 mg/dL (ref 8.7–10.2)
Creatinine, Ser: 0.9 mg/dL (ref 0.57–1.00)
GFR calc Af Amer: 93 mL/min/{1.73_m2} (ref >59)
GFR calc non Af Amer: 81 mL/min/{1.73_m2} (ref >59)
Glucose: 91 mg/dL (ref 65–99)
Potassium: 4.2 mmol/L (ref 3.5–5.2)
Sodium: 140 mmol/L (ref 134–144)

## 2019-05-04 ENCOUNTER — Other Ambulatory Visit: Payer: Self-pay | Admitting: Family

## 2019-05-04 DIAGNOSIS — I10 Essential (primary) hypertension: Secondary | ICD-10-CM

## 2019-07-30 ENCOUNTER — Encounter: Payer: Self-pay | Admitting: Family

## 2019-07-30 ENCOUNTER — Ambulatory Visit (INDEPENDENT_AMBULATORY_CARE_PROVIDER_SITE_OTHER): Payer: 59 | Admitting: Family

## 2019-07-30 VITALS — BP 109/72

## 2019-07-30 DIAGNOSIS — F411 Generalized anxiety disorder: Secondary | ICD-10-CM

## 2019-07-30 DIAGNOSIS — E669 Obesity, unspecified: Secondary | ICD-10-CM

## 2019-07-30 DIAGNOSIS — I10 Essential (primary) hypertension: Secondary | ICD-10-CM

## 2019-07-30 DIAGNOSIS — F17200 Nicotine dependence, unspecified, uncomplicated: Secondary | ICD-10-CM | POA: Diagnosis not present

## 2019-07-30 MED ORDER — LISINOPRIL-HYDROCHLOROTHIAZIDE 20-12.5 MG PO TABS: ORAL_TABLET | ORAL | 4 refills | Status: AC

## 2019-07-30 NOTE — Progress Notes (Signed)
Virtual Visit via telephone Note Due to COVID-19 pandemic this visit was conducted virtually. This visit type was conducted due to national recommendations for restrictions regarding the COVID-19 Pandemic (e.g. social distancing, sheltering in place) in an effort to limit this patient's exposure and mitigate transmission in our community. All issues noted in this document were discussed and addressed.  A physical exam was not performed with this format.  I connected with Wendy Thompson on 07/30/19 at 10:30 AM by telephone and verified that I am speaking with the correct person using two identifiers. Wendy Thompson is currently located at home and no one is currently with her during visit. The provider, Evelina Dun, FNP is located in their office at time of visit.  I discussed the limitations, risks, security and privacy concerns of performing an evaluation and management service by telephone and the availability of in person appointments. I also discussed with the patient that there may be a patient responsible charge related to this service. The patient expressed understanding and agreed to proceed.   History and Present Illness:  Pt calls the office today for chronic follow up. She states her dad just passed away last week. States this was a hard time, but is doing well.  Hypertension This is a chronic problem. The current episode started more than 1 year ago. The problem has been resolved since onset. The problem is controlled. Associated symptoms include anxiety. Pertinent negatives include no headaches, malaise/fatigue, peripheral edema or shortness of breath. The current treatment provides moderate improvement. There is no history of kidney disease, CAD/MI, CVA or heart failure.  Anxiety Presents for follow-up visit. Symptoms include excessive worry, irritability, nervous/anxious behavior and restlessness. Patient reports no depressed mood or shortness of breath. Symptoms occur occasionally.  The severity of symptoms is moderate. The quality of sleep is good.    Nicotine Dependence Presents for follow-up visit. Symptoms include irritability. Her urge triggers include company of smokers. The symptoms have been stable. She smokes < 1/2 a pack of cigarettes per day.      Review of Systems  Constitutional: Positive for irritability. Negative for malaise/fatigue.  Respiratory: Negative for shortness of breath.   Neurological: Negative for headaches.  Psychiatric/Behavioral: The patient is nervous/anxious.      Observations/Objective: No SOB or distress noted  Assessment and Plan: 1. Essential hypertension, benign - BMP8+EGFR; Future  2. Obesity (BMI 30-39.9) - BMP8+EGFR; Future  3. Current smoker - BMP8+EGFR; Future  4. GAD (generalized anxiety disorder) - BMP8+EGFR; Future  5. Essential hypertension - BMP8+EGFR; Future - lisinopril-hydrochlorothiazide (ZESTORETIC) 20-12.5 MG tablet; TAKE ONE (1) TABLET EACH DAY  Dispense: 90 tablet; Refill: 4   Follow Up Instructions: 6 months for CPE with PAP    I discussed the assessment and treatment plan with the patient. The patient was provided an opportunity to ask questions and all were answered. The patient agreed with the plan and demonstrated an understanding of the instructions.   The patient was advised to call back or seek an in-person evaluation if the symptoms worsen or if the condition fails to improve as anticipated.  The above assessment and management plan was discussed with the patient. The patient verbalized understanding of and has agreed to the management plan. Patient is aware to call the clinic if symptoms persist or worsen. Patient is aware when to return to the clinic for a follow-up visit. Patient educated on when it is appropriate to go to the emergency department.   Time call ended:10:44  AM     I provided 14 minutes of non-face-to-face time during this encounter.    Evelina Dun, FNP

## 2019-08-18 ENCOUNTER — Encounter: Payer: Self-pay | Admitting: Family

## 2019-08-18 ENCOUNTER — Ambulatory Visit: Payer: 59 | Admitting: Family

## 2019-08-18 ENCOUNTER — Other Ambulatory Visit: Payer: Self-pay

## 2019-08-18 VITALS — BP 119/82 | Temp 98.0°F | Ht 66.5 in | Wt 223.2 lb

## 2019-08-18 DIAGNOSIS — L0291 Cutaneous abscess, unspecified: Secondary | ICD-10-CM

## 2019-08-18 MED ORDER — SULFAMETHOXAZOLE-TRIMETHOPRIM 800-160 MG PO TABS: 1.0000 | ORAL_TABLET | Freq: Two times a day (BID) | ORAL | 0 refills | Status: AC

## 2019-08-18 NOTE — Progress Notes (Signed)
   Subjective:    Patient ID: Wendy Thompson, female    DOB: [birth year], 39 y.o.   MRN: [id number]  Chief Complaint  Patient presents with  . Cyst    left breast painful in last couple of days    HPI PT presents to the office today with an abscess on left breast. She reports she has had a spot on and off over the last 6 months. She states over the last three days the area has become sore, tenderness, and warm. Denies any discharge or fevers.   She has soaked warm water with no relief.    Review of Systems  All other systems reviewed and are negative.      Objective:   Physical Exam Vitals reviewed.  Constitutional:      General: She is not in acute distress.    Appearance: She is well-developed.  HENT:     Head: Normocephalic and atraumatic.     Right Ear: Tympanic membrane normal.     Left Ear: Tympanic membrane normal.  Eyes:     Pupils: Pupils are equal, round, and reactive to light.  Neck:     Thyroid: No thyromegaly.  Cardiovascular:     Rate and Rhythm: Normal rate and regular rhythm.     Heart sounds: Normal heart sounds. No murmur.  Pulmonary:     Effort: Pulmonary effort is normal. No respiratory distress.     Breath sounds: Normal breath sounds. No wheezing.  Chest:       Comments: Soft, tender and warm abscess Abdominal:     General: Bowel sounds are normal. There is no distension.     Palpations: Abdomen is soft.     Tenderness: There is no abdominal tenderness.  Musculoskeletal:        General: No tenderness. Normal range of motion.     Cervical back: Normal range of motion and neck supple.  Skin:    General: Skin is warm and dry.  Neurological:     Mental Status: She is alert and oriented to person, place, and time.     Cranial Nerves: No cranial nerve deficit.     Deep Tendon Reflexes: Reflexes are normal and symmetric.  Psychiatric:        Behavior: Behavior normal.        Thought Content: Thought content normal.        Judgment: Judgment  normal.     BP 119/82   Temp 98 F (36.7 C) (Temporal)   Ht 5' 6.5" (1.689 m)   Wt 223 lb 3.2 oz (101.2 kg)   LMP 07/26/2019 (Approximate)   SpO2 98%   BMI 35.49 kg/m   Verbal consent given by patient. Betadine prep Small incision made, approx 3-5 ml of purulent discharge      Assessment & Plan:  [name] comes in today with chief complaint of Cyst (left breast painful in last couple of days)   Diagnosis and orders addressed:  1. Abscess Warm compresses Do not pick or squeeze Call if area worsen or does not improve  - sulfamethoxazole-trimethoprim (BACTRIM DS) 800-160 MG tablet; Take 1 tablet by mouth 2 (two) times daily.  Dispense: 14 tablet; Refill: 0  Jannifer Rodney, FNP

## 2019-08-18 NOTE — Patient Instructions (Signed)
Skin Abscess  A skin abscess is an infected area on or under your skin that contains a collection of pus and other material. An abscess may also be called a furuncle, carbuncle, or boil. An abscess can occur in or on almost any part of your body. Some abscesses break open (rupture) on their own. Most continue to get worse unless they are treated. The infection can spread deeper into the body and eventually into your blood, which can make you feel ill. Treatment usually involves draining the abscess. What are the causes? An abscess occurs when germs, like bacteria, pass through your skin and cause an infection. This may be caused by:  A scrape or cut on your skin.  A puncture wound through your skin, including a needle injection or insect bite.  Blocked oil or sweat glands.  Blocked and infected hair follicles.  A cyst that forms beneath your skin (sebaceous cyst) and becomes infected. What increases the risk? This condition is more likely to develop in people who:  Have a weak body defense system (immune system).  Have diabetes.  Have dry and irritated skin.  Get frequent injections or use illegal IV drugs.  Have a foreign body in a wound, such as a splinter.  Have problems with their lymph system or veins. What are the signs or symptoms? Symptoms of this condition include:  A painful, firm bump under the skin.  A bump with pus at the top. This may break through the skin and drain. Other symptoms include:  Redness surrounding the abscess site.  Warmth.  Swelling of the lymph nodes (glands) near the abscess.  Tenderness.  A sore on the skin. How is this diagnosed? This condition may be diagnosed based on:  A physical exam.  Your medical history.  A sample of pus. This may be used to find out what is causing the infection.  Blood tests.  Imaging tests, such as an ultrasound, CT scan, or MRI. How is this treated? A small abscess that drains on its own may  not need treatment. Treatment for larger abscesses may include:  Moist heat or heat pack applied to the area several times a day.  A procedure to drain the abscess (incision and drainage).  Antibiotic medicines. For a severe abscess, you may first get antibiotics through an IV and then change to antibiotics by mouth. Follow these instructions at home: Medicines   Take over-the-counter and prescription medicines only as told by your health care provider.  If you were prescribed an antibiotic medicine, take it as told by your health care provider. Do not stop taking the antibiotic even if you start to feel better. Abscess care   If you have an abscess that has not drained, apply heat to the affected area. Use the heat source that your health care provider recommends, such as a moist heat pack or a heating pad. ? Place a towel between your skin and the heat source. ? Leave the heat on for 20-30 minutes. ? Remove the heat if your skin turns bright red. This is especially important if you are unable to feel pain, heat, or cold. You may have a greater risk of getting burned.  Follow instructions from your health care provider about how to take care of your abscess. Make sure you: ? Cover the abscess with a bandage (dressing). ? Change your dressing or gauze as told by your health care provider. ? Wash your hands with soap and water before you change the   dressing or gauze. If soap and water are not available, use hand sanitizer.  Check your abscess every day for signs of a worsening infection. Check for: ? More redness, swelling, or pain. ? More fluid or blood. ? Warmth. ? More pus or a bad smell. General instructions  To avoid spreading the infection: ? Do not share personal care items, towels, or hot tubs with others. ? Avoid making skin contact with other people.  Keep all follow-up visits as told by your health care provider. This is important. Contact a health care provider if  you have:  More redness, swelling, or pain around your abscess.  More fluid or blood coming from your abscess.  Warm skin around your abscess.  More pus or a bad smell coming from your abscess.  A fever.  Muscle aches.  Chills or a general ill feeling. Get help right away if you:  Have severe pain.  See red streaks on your skin spreading away from the abscess. Summary  A skin abscess is an infected area on or under your skin that contains a collection of pus and other material.  A small abscess that drains on its own may not need treatment.  Treatment for larger abscesses may include having a procedure to drain the abscess and taking an antibiotic. This information is not intended to replace advice given to you by your health care provider. Make sure you discuss any questions you have with your health care provider. Document Revised: 10/30/2018 Document Reviewed: 08/22/2017 [employer] Patient Education  2020 [employer]
